# Patient Record
Sex: Female | Born: 1960 | Race: White | Hispanic: No | Smoking: Current every day smoker
Health system: Southern US, Community
[De-identification: ages and names within clinical notes are randomized; demographics above are authoritative.]

## PROBLEM LIST (undated history)

## (undated) DIAGNOSIS — F419 Anxiety disorder, unspecified: Secondary | ICD-10-CM

## (undated) DIAGNOSIS — E1101 Type 2 diabetes mellitus with hyperosmolarity with coma: Secondary | ICD-10-CM

## (undated) DIAGNOSIS — I10 Essential (primary) hypertension: Secondary | ICD-10-CM

## (undated) DIAGNOSIS — J449 Chronic obstructive pulmonary disease, unspecified: Secondary | ICD-10-CM

## (undated) DIAGNOSIS — Z923 Personal history of irradiation: Secondary | ICD-10-CM

## (undated) DIAGNOSIS — T7840XA Allergy, unspecified, initial encounter: Secondary | ICD-10-CM

## (undated) HISTORY — DX: Chronic obstructive pulmonary disease, unspecified: J44.9

## (undated) HISTORY — PX: ABDOMINAL HYSTERECTOMY: SHX81

## (undated) HISTORY — DX: Allergy, unspecified, initial encounter: T78.40XA

## (undated) HISTORY — DX: Type 2 diabetes mellitus with hyperosmolarity with coma: E11.01

---

## 2001-12-15 ENCOUNTER — Emergency Department (HOSPITAL_COMMUNITY): Admission: EM | Admit: 2001-12-15 | Discharge: 2001-12-15 | Payer: Self-pay | Admitting: Emergency Medicine

## 2005-04-25 ENCOUNTER — Emergency Department (HOSPITAL_COMMUNITY): Admission: EM | Admit: 2005-04-25 | Discharge: 2005-04-25 | Payer: Self-pay | Admitting: Family Medicine

## 2005-05-09 ENCOUNTER — Emergency Department (HOSPITAL_COMMUNITY): Admission: EM | Admit: 2005-05-09 | Discharge: 2005-05-09 | Payer: Self-pay | Admitting: Family Medicine

## 2005-05-29 ENCOUNTER — Emergency Department (HOSPITAL_COMMUNITY): Admission: EM | Admit: 2005-05-29 | Discharge: 2005-05-29 | Payer: Self-pay | Admitting: Emergency Medicine

## 2006-06-04 ENCOUNTER — Ambulatory Visit (HOSPITAL_COMMUNITY): Admission: RE | Admit: 2006-06-04 | Discharge: 2006-06-04 | Payer: Self-pay | Admitting: Family Medicine

## 2006-06-26 ENCOUNTER — Ambulatory Visit: Payer: Self-pay | Admitting: Internal Medicine

## 2006-06-29 ENCOUNTER — Encounter (INDEPENDENT_AMBULATORY_CARE_PROVIDER_SITE_OTHER): Payer: Self-pay | Admitting: Specialist

## 2006-06-29 ENCOUNTER — Ambulatory Visit (HOSPITAL_COMMUNITY): Admission: RE | Admit: 2006-06-29 | Discharge: 2006-06-29 | Payer: Self-pay | Admitting: Internal Medicine

## 2006-06-29 ENCOUNTER — Ambulatory Visit: Payer: Self-pay | Admitting: Internal Medicine

## 2006-07-04 ENCOUNTER — Ambulatory Visit (HOSPITAL_COMMUNITY): Admission: RE | Admit: 2006-07-04 | Discharge: 2006-07-04 | Payer: Self-pay | Admitting: Family Medicine

## 2006-07-11 ENCOUNTER — Ambulatory Visit: Payer: Self-pay | Admitting: Internal Medicine

## 2006-07-30 ENCOUNTER — Ambulatory Visit (HOSPITAL_COMMUNITY): Admission: RE | Admit: 2006-07-30 | Discharge: 2006-07-30 | Payer: Self-pay | Admitting: General Surgery

## 2006-12-21 ENCOUNTER — Emergency Department (HOSPITAL_COMMUNITY): Admission: EM | Admit: 2006-12-21 | Discharge: 2006-12-21 | Payer: Self-pay | Admitting: Emergency Medicine

## 2007-07-09 ENCOUNTER — Ambulatory Visit (HOSPITAL_COMMUNITY): Admission: RE | Admit: 2007-07-09 | Discharge: 2007-07-09 | Payer: Self-pay | Admitting: Family Medicine

## 2008-07-20 ENCOUNTER — Ambulatory Visit (HOSPITAL_COMMUNITY): Admission: RE | Admit: 2008-07-20 | Discharge: 2008-07-20 | Payer: Self-pay | Admitting: Family Medicine

## 2008-12-13 ENCOUNTER — Emergency Department (HOSPITAL_COMMUNITY): Admission: EM | Admit: 2008-12-13 | Discharge: 2008-12-13 | Payer: Self-pay | Admitting: Emergency Medicine

## 2009-06-17 ENCOUNTER — Emergency Department (HOSPITAL_COMMUNITY): Admission: EM | Admit: 2009-06-17 | Discharge: 2009-06-17 | Payer: Self-pay | Admitting: Emergency Medicine

## 2009-08-13 ENCOUNTER — Ambulatory Visit (HOSPITAL_COMMUNITY): Admission: RE | Admit: 2009-08-13 | Discharge: 2009-08-13 | Payer: Self-pay | Admitting: Family Medicine

## 2009-11-27 ENCOUNTER — Emergency Department (HOSPITAL_COMMUNITY): Admission: EM | Admit: 2009-11-27 | Discharge: 2009-11-27 | Payer: Self-pay | Admitting: Family Medicine

## 2009-12-20 ENCOUNTER — Ambulatory Visit: Payer: Self-pay | Admitting: Internal Medicine

## 2009-12-20 DIAGNOSIS — R5381 Other malaise: Secondary | ICD-10-CM

## 2009-12-20 DIAGNOSIS — R5383 Other fatigue: Secondary | ICD-10-CM

## 2009-12-20 DIAGNOSIS — K625 Hemorrhage of anus and rectum: Secondary | ICD-10-CM | POA: Insufficient documentation

## 2009-12-20 DIAGNOSIS — R109 Unspecified abdominal pain: Secondary | ICD-10-CM

## 2009-12-20 DIAGNOSIS — Z860100 Personal history of colon polyps, unspecified: Secondary | ICD-10-CM | POA: Insufficient documentation

## 2009-12-31 ENCOUNTER — Encounter: Payer: Self-pay | Admitting: Internal Medicine

## 2010-01-13 ENCOUNTER — Ambulatory Visit (HOSPITAL_COMMUNITY): Admission: RE | Admit: 2010-01-13 | Discharge: 2010-01-13 | Payer: Self-pay | Admitting: Internal Medicine

## 2010-01-13 ENCOUNTER — Ambulatory Visit: Payer: Self-pay | Admitting: Internal Medicine

## 2010-01-17 ENCOUNTER — Encounter: Payer: Self-pay | Admitting: Internal Medicine

## 2010-02-11 ENCOUNTER — Emergency Department (HOSPITAL_COMMUNITY): Admission: EM | Admit: 2010-02-11 | Discharge: 2010-02-11 | Payer: Self-pay | Admitting: Emergency Medicine

## 2010-02-17 ENCOUNTER — Emergency Department (HOSPITAL_COMMUNITY): Admission: EM | Admit: 2010-02-17 | Discharge: 2010-02-17 | Payer: Self-pay | Admitting: Emergency Medicine

## 2010-03-03 ENCOUNTER — Emergency Department (HOSPITAL_COMMUNITY): Admission: EM | Admit: 2010-03-03 | Discharge: 2010-03-03 | Payer: Self-pay | Admitting: Emergency Medicine

## 2010-03-07 ENCOUNTER — Emergency Department (HOSPITAL_COMMUNITY): Admission: EM | Admit: 2010-03-07 | Discharge: 2010-03-07 | Payer: Self-pay | Admitting: Emergency Medicine

## 2010-03-07 ENCOUNTER — Telehealth (INDEPENDENT_AMBULATORY_CARE_PROVIDER_SITE_OTHER): Payer: Self-pay

## 2010-03-08 ENCOUNTER — Ambulatory Visit: Payer: Self-pay | Admitting: Internal Medicine

## 2010-03-08 DIAGNOSIS — G44309 Post-traumatic headache, unspecified, not intractable: Secondary | ICD-10-CM | POA: Insufficient documentation

## 2010-03-08 DIAGNOSIS — K920 Hematemesis: Secondary | ICD-10-CM | POA: Insufficient documentation

## 2010-03-08 DIAGNOSIS — M545 Low back pain, unspecified: Secondary | ICD-10-CM | POA: Insufficient documentation

## 2010-03-08 DIAGNOSIS — S139XXA Sprain of joints and ligaments of unspecified parts of neck, initial encounter: Secondary | ICD-10-CM

## 2010-03-08 DIAGNOSIS — R1013 Epigastric pain: Secondary | ICD-10-CM | POA: Insufficient documentation

## 2010-03-09 ENCOUNTER — Ambulatory Visit: Payer: Self-pay | Admitting: Internal Medicine

## 2010-03-09 ENCOUNTER — Ambulatory Visit (HOSPITAL_COMMUNITY): Admission: RE | Admit: 2010-03-09 | Discharge: 2010-03-09 | Payer: Self-pay | Admitting: Internal Medicine

## 2010-04-20 ENCOUNTER — Ambulatory Visit: Payer: Self-pay | Admitting: Gastroenterology

## 2010-05-29 ENCOUNTER — Encounter: Payer: Self-pay | Admitting: Family Medicine

## 2010-06-09 NOTE — Letter (Signed)
Summary: ED REFERRAL  ED REFERRAL   Imported By: Rexene Alberts 03/09/2010 10:28:46  _____________________________________________________________________  External Attachment:    Type:   Image     Comment:   External Document

## 2010-06-09 NOTE — Assessment & Plan Note (Signed)
Summary: some rectal bleeding (not alot),consult for tcs/ss   Visit Type:  f/u Primary Care Provider:  Health Dept/does not go anymore  Chief Complaint:  Time for TCS/rectal bleeding.  History of Present Illness: Bethany Mills is here for recent rectal bleeding and to schedule surveillance TCS. She has FH of CRC in 53 y/o brother, personal h/o tubulovillous adenoma 3 yrs ago.  Recently small volume brbpr on toilet tissue after heavy lifting. No constipation, diarrhea, no rectal pain. Has some lower abd pain off/on in pelvis for couple of months. Has sharp pain, but rare. Has not seen gyn in over 10 yrs. Had hysterectomy for "tumor" but still has both ovaries. C/O fatigue. No heartburn, n/v. Weight up 30 pounds over 3 years. Takes ibuprofen as needed but not daily.   She c/o more "growths" around her anus. States she first had them prior to her last TCS. Treated with multiple creams that never really helped.  Current Medications (verified): 1)  Ibuprofen 200 Mg Tabs (Ibuprofen) .... Three Tablets As Needed 2)  Claritin 10 Mg Tabs (Loratadine) .... Take 1 Tablet By Mouth Once A Day 3)  Benadryl 25 Mg Tabs (Diphenhydramine Hcl) .... Two Tablets For Allergies Every 4-6 Hours  Allergies (verified): 1)  ! Codeine  Past History:  Past Medical History: Anxiety Disorder Anal Fissure TCS 2/08, Dr. Rourk-->?tiny thrombosed hemorrhoid vs anal fissure, large pedunculated rectal polyp, Tubulovillous Adenoma  Past Surgical History: Anal Fissurectomy w/sphincterotomy on 07/30/06 Right knee surgery Breast bx, benign Hysterectomy, partial, for tumor  Family History: Brother, colon cancer, age 27 Mother, breast cancer, age 22s  Social History: Single. Two sons.  Less than one ppd. Beer, 2-4 (12 ounce) twice per week. Used to drink more heavily.  Review of Systems General:  Complains of fatigue and weakness; denies fever, chills, sweats, anorexia, and weight loss. Eyes:  Denies vision loss. ENT:   Denies nasal congestion, sore throat, hoarseness, and difficulty swallowing. CV:  Denies chest pains, angina, palpitations, dyspnea on exertion, and peripheral edema. Resp:  Complains of dyspnea with exercise; denies dyspnea at rest, cough, sputum, and wheezing. GI:  See HPI. GU:  Denies urinary burning and blood in urine. MS:  Denies joint pain / LOM. Derm:  Denies rash and itching. Neuro:  Denies weakness, frequent headaches, memory loss, and confusion. Psych:  Denies depression and anxiety. Endo:  Complains of unusual weight change; 30 pound wt gain, three yrs. Heme:  Denies bruising and bleeding. Allergy:  Denies hives and rash.  Vital Signs:  Patient profile:   50 year old female Height:      64 inches Weight:      134 pounds BMI:     23.08 Temp:     98.1 degrees F oral Pulse rate:   80 / minute BP sitting:   120 / 90  (left arm) Cuff size:   regular  Vitals Entered By: Cloria Spring LPN (December 20, 2009 10:37 AM)  Physical Exam  General:  Well developed, well nourished, no acute distress. Head:  Normocephalic and atraumatic. Eyes:  sclera nonicteric Mouth:  Oropharyngeal mucosa moist, pink.  No lesions, erythema or exudate.    Neck:  Supple; no masses or thyromegaly. Lungs:  Clear throughout to auscultation. Heart:  Regular rate and rhythm; no murmurs, rubs,  or bruits. Abdomen:  Abd soft. Positive BS. ND. No HSM or masses. No abd bruit. Mild tenderness in pelvis bilaterally. No rebound or guarding. Rectal:  deferred until time of colonoscopy.   Extremities:  No clubbing, cyanosis, edema or deformities noted. Neurologic:  Alert and  oriented x4;  grossly normal neurologically. Skin:  Intact without significant lesions or rashes. Cervical Nodes:  No significant cervical adenopathy. Psych:  Alert and cooperative. Normal mood and affect.  Impression & Recommendations:  Problem # 1:  TUBULOVILLOUS ADENOMA, COLON, HX OF (ICD-V12.72)  H/O advance adenoma, 3 yrs ago, FH  of CRC in sibling. Due for high risk surveillance TCS. Recent rectal bleeding possible hemorrhoidal. Patient also c/o growths around her anus. Will evaluate at time of TCS. Colonoscopy to be performed in near future.  Risks, alternatives, and benefits including but not limited to the risk of reaction to medication, bleeding, infection, and perforation were addressed.  Patient voiced understanding and provided verbal consent.   Due to failed conscious sedation, h/o alcohol abuse in past, would recommend procedure in OR.   Orders: New Patient Level III (21308)  Problem # 2:  WEAKNESS (ICD-780.79)  Weakness and fatigue. No PCP, not seen in three years by MD. Routine labs at time of preop including CMET, TSH, CBC.   Orders: New Patient Level III (65784)  Problem # 3:  PELVIC  PAIN (ICD-789.09)  Referral to [facility] for pap, physical exam, chronic pelvic pain.  Orders: New Patient Level III (69629)  Appended Document: some rectal bleeding (not alot),consult for tcs/ss Pre-op labs: H/H normal. Met -7 unremarkable. Pt to f/u with PCP/GYN.

## 2010-06-09 NOTE — Letter (Signed)
Summary: Patient Notice, Colon Biopsy Results  [facility]  [address]   [place], [place] [address]   Phone: [phone]  Fax: [phone]       January 17, 2010   Bethany Mills [address] [place], [place]  [address] 05/23/1960    Dear [name],  I am pleased to inform you that the biopsies taken during your recent colonoscopy did not show any evidence of cancer upon pathologic examination.  Additional information/recommendations:  Continue with the treatment plan as outlined on the day of your exam.  You should have a repeat colonoscopy examination  in 5 years.  Please call us if you are having persistent problems or have questions about your condition that have not been fully answered at this time.  Sincerely,    R. Roetta Sessions MD, Caleen Essex  [facility] Ph: [phone]    Fax: [phone]   Appended Document: Patient Notice, Colon Biopsy Results letter mailed to pt  Appended Document: Patient Notice, Colon Biopsy Results REMINDER FOR REPEAT TCS IN 5 YRS IS IN THE COMPUTER

## 2010-06-09 NOTE — Progress Notes (Signed)
Summary: phone note/ pt having blood in stool/has appt tomorrow  Phone Note Call from Patient   Caller: Patient Summary of Call: Pt called and she has appt to see Tana Coast, PA tomorrow. She reports fever (although she doesn't have thermometer). She is having chills and had blood in stool x 2 this AM, and on paper also. She said it was not profuse, but she is just concerned and wanted advise.  Initial call taken by: Cloria Spring LPN,  March 07, 2010 10:52 AM     Appended Document: phone note/ pt having blood in stool/has appt tomorrow Unless she is having diarrhea, fever unlikely related to rectal bleeding. Advise is she truly has fever, address with PCP. Otherwise, see if Tobi Bastos can see her this afternoon.  Appended Document: phone note/ pt having blood in stool/has appt tomorrow Called pt, her Mom answered and said she is at the hospital now. She has appt in the AM and wanted to know if she shouuld keep that appt. I told her we would not cancel it, to let ER know  and let us know what they wanted.
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
## 2010-06-09 NOTE — Letter (Signed)
Summary: TCS ORDER  TCS ORDER   Imported By: Rosine Beat 12/31/2009 14:24:45  _____________________________________________________________________  External Attachment:    Type:   Image     Comment:   External Document

## 2010-06-09 NOTE — Letter (Signed)
Summary: EGD ORDER  EGD ORDER   Imported By: Rexene Alberts 03/08/2010 12:09:46  _____________________________________________________________________  External Attachment:    Type:   Image     Comment:   External Document

## 2010-07-20 LAB — URINALYSIS, ROUTINE W REFLEX MICROSCOPIC
Bilirubin Urine: NEGATIVE
Glucose, UA: NEGATIVE mg/dL
Ketones, ur: NEGATIVE mg/dL
Leukocytes, UA: NEGATIVE
Nitrite: NEGATIVE
Specific Gravity, Urine: 1.01 (ref 1.005–1.030)
Specific Gravity, Urine: <1.005 — ABNORMAL LOW (ref 1.005–1.030)
Urobilinogen, UA: 0.2 mg/dL (ref 0.0–1.0)
pH: 5.5 (ref 5.0–8.0)

## 2010-07-20 LAB — DIFFERENTIAL
Basophils Absolute: 0 10*3/uL (ref 0.0–0.1)
Basophils Relative: 0 % (ref 0–1)
Eosinophils Relative: 2 % (ref 0–5)
Lymphocytes Relative: 32 % (ref 12–46)
Lymphocytes Relative: 35 % (ref 12–46)
Monocytes Absolute: 0.9 10*3/uL (ref 0.1–1.0)
Monocytes Absolute: 0.9 10*3/uL (ref 0.1–1.0)
Monocytes Relative: 10 % (ref 3–12)
Monocytes Relative: 11 % (ref 3–12)
Neutro Abs: 4.9 10*3/uL (ref 1.7–7.7)

## 2010-07-20 LAB — CBC
HCT: 39.7 % (ref 36.0–46.0)
Hemoglobin: 13.5 g/dL (ref 12.0–15.0)
MCH: 31.6 pg (ref 26.0–34.0)
MCHC: 34.1 g/dL (ref 30.0–36.0)
Platelets: 377 10*3/uL (ref 150–400)
RBC: 3.99 MIL/uL (ref 3.87–5.11)
RDW: 14.1 % (ref 11.5–15.5)
WBC: 7.8 10*3/uL (ref 4.0–10.5)

## 2010-07-20 LAB — COMPREHENSIVE METABOLIC PANEL
AST: 22 U/L (ref 0–37)
Albumin: 3.7 g/dL (ref 3.5–5.2)
Albumin: 4.3 g/dL (ref 3.5–5.2)
Alkaline Phosphatase: 83 U/L (ref 39–117)
BUN: 3 mg/dL — ABNORMAL LOW (ref 6–23)
Chloride: 99 mEq/L (ref 96–112)
Creatinine, Ser: 0.49 mg/dL (ref 0.4–1.2)
GFR calc Af Amer: >60 mL/min (ref >60)
Potassium: 3.4 mEq/L — ABNORMAL LOW (ref 3.5–5.1)
Potassium: 3.6 mEq/L (ref 3.5–5.1)
Total Bilirubin: 0.5 mg/dL (ref 0.3–1.2)
Total Protein: 6.5 g/dL (ref 6.0–8.3)
Total Protein: 7.5 g/dL (ref 6.0–8.3)

## 2010-07-20 LAB — SAMPLE TO BLOOD BANK

## 2010-07-20 LAB — URINE MICROSCOPIC-ADD ON

## 2010-07-21 LAB — BASIC METABOLIC PANEL
BUN: 7 mg/dL (ref 6–23)
Chloride: 102 mEq/L (ref 96–112)
Glucose, Bld: 101 mg/dL — ABNORMAL HIGH (ref 70–99)
Potassium: 4.1 mEq/L (ref 3.5–5.1)

## 2010-07-27 LAB — CBC
HCT: 42.8 % (ref 36.0–46.0)
Hemoglobin: 14.5 g/dL (ref 12.0–15.0)
MCHC: 33.9 g/dL (ref 30.0–36.0)
RDW: 13 % (ref 11.5–15.5)

## 2010-07-27 LAB — DIFFERENTIAL
Basophils Absolute: 0 10*3/uL (ref 0.0–0.1)
Eosinophils Relative: 2 % (ref 0–5)
Lymphocytes Relative: 21 % (ref 12–46)
Monocytes Absolute: 0.8 10*3/uL (ref 0.1–1.0)
Monocytes Relative: 8 % (ref 3–12)

## 2010-07-27 LAB — POCT CARDIAC MARKERS
CKMB, poc: <1 ng/mL — ABNORMAL LOW (ref 1.0–8.0)
Myoglobin, poc: 33.1 ng/mL (ref 12–200)
Troponin i, poc: <0.05 ng/mL (ref 0.00–0.09)
Troponin i, poc: <0.05 ng/mL (ref 0.00–0.09)

## 2010-07-27 LAB — BASIC METABOLIC PANEL
CO2: 26 mEq/L (ref 19–32)
GFR calc non Af Amer: >60 mL/min (ref >60)
Glucose, Bld: 109 mg/dL — ABNORMAL HIGH (ref 70–99)
Potassium: 3.6 mEq/L (ref 3.5–5.1)
Sodium: 139 mEq/L (ref 135–145)

## 2010-09-01 ENCOUNTER — Other Ambulatory Visit (HOSPITAL_COMMUNITY): Payer: Self-pay | Admitting: Family Medicine

## 2010-09-01 DIAGNOSIS — Z139 Encounter for screening, unspecified: Secondary | ICD-10-CM

## 2010-09-06 ENCOUNTER — Ambulatory Visit (HOSPITAL_COMMUNITY): Payer: Self-pay

## 2010-09-13 ENCOUNTER — Ambulatory Visit (HOSPITAL_COMMUNITY)
Admission: RE | Admit: 2010-09-13 | Discharge: 2010-09-13 | Disposition: A | Discharge status: Home / Self Care | Payer: Self-pay | Source: Ambulatory Visit | Attending: Family Medicine | Admitting: Family Medicine

## 2010-09-13 DIAGNOSIS — Z139 Encounter for screening, unspecified: Secondary | ICD-10-CM

## 2010-09-23 NOTE — H&P (Signed)
NAMEMARDI, CANNADY              ACCOUNT NO.:  [id number]   MEDICAL RECORD NO.:  [id number]          PATIENT TYPE:  AMB   LOCATION:  DAY                           FACILITY:  [facility]   PHYSICIAN:  Dalia Heading, M.D.  DATE OF BIRTH:  06-12-1960   DATE OF ADMISSION:  DATE OF DISCHARGE:  LH                              HISTORY & PHYSICAL   CHIEF COMPLAINT:  Anal fissure.   HISTORY OF PRESENT ILLNESS:  The patient is a 50 year old white female  who is referred for evaluation and treatment of rectal pain.  It has  been present for over 1 year.  She has been on suppositories which have  not helped.  She is not constipated, though she sometimes strains to  move her bowels.   PAST MEDICAL HISTORY:  Includes anxiety.   PAST SURGICAL HISTORY:  Colonoscopy.   CURRENT MEDICATIONS:  Nitroglycerin ointment to the rectum.   ALLERGIES:  Codeine.   REVIEW OF SYSTEMS:  The patient smokes a pack cigarettes a day.  She  drinks alcohol socially.  She denies any other cardiopulmonary  difficulties or bleeding disorders.   PHYSICAL EXAMINATION:  GENERAL:  The patient is a well-developed, well-  nourished white female in no acute distress.  Mild anxiety is noted.  LUNGS:  Clear to auscultation with equal breath sounds bilaterally.  HEART:  Examination reveals regular rate and rhythm without S3, S4 or  murmurs.  RECTAL:  Examination reveals a posterior anal fissure with a sentinel  tag present.  Exam is limited secondary to pain.   IMPRESSION:  Anal fissure.   PLAN:  The patient is scheduled for an anal fissurectomy with  sphincterotomy on 07/30/2006.  The risks and benefits of the procedure  including bleeding, infection, incontinence, and recurrence of the  fissure were fully explained to the patient, gave informed consent.      Dalia Heading, M.D.  Electronically Signed     MAJ/MEDQ  D:  07/26/2006  T:  07/26/2006  Job:  [id number]   cc:   Dalia Heading, M.D.  Fax: [phone]   R.  Roetta Sessions, M.D.  [address]  [place]  [place] [address]   Selinda Flavin  Fax: [phone]

## 2010-09-23 NOTE — Consult Note (Signed)
NAME:  Bethany Mills, Bethany Mills              ACCOUNT NO.:  [id number]   MEDICAL RECORD NO.:  [id number]          PATIENT TYPE:  AMB   LOCATION:  DAY                           FACILITY:  [facility]   PHYSICIAN:  R. Roetta Sessions, M.D. DATE OF BIRTH:  04-10-1961   DATE OF CONSULTATION:  06/26/2006  DATE OF DISCHARGE:                                 CONSULTATION   REASON FOR CONSULTATION:  Rectal pain, discharge, rectal bleeding.   REFERRING PHYSICIAN:  Dr. Selinda Flavin and Dierdre Forth at [facility].   HISTORY OF PRESENT ILLNESS:  Bethany Mills is a pleasant 50 year old  Caucasian female sent at the courtesy of Dr. Selinda Flavin and Dierdre Forth to further evaluate a 1-year history of intermittent rectal  bleeding, rectal discharge and rectal pain. She describes severe burning  pain after she has a bowel movement. She denies having pain exactly when  she has a bowel movement, then has some discharge which burns her  perianal area. She has quite a bit of irritation even when she urinates,  when urine comes down between the vagina and the rectum, she has severe  pain to the point where she is now afraid to eat and has lost 10 pounds  in the past 1 year insidiously because of this problem that she reports.  She really has not had any abdominal pain; had some rectal bleeding  previously but none recently.   FAMILY HISTORY:  Significant in a brother diagnosed with colon cancer  age 67.   She has never had her lower GI tract imaged. She has been on various  creams without much improvement. When she has to pick something up as  light as 10 pounds or so, this tends to exacerbate the irritating  sensation she gets around the rectum.   PAST MEDICAL HISTORY:  1. Significant for anxiety neurosis.  2. A benign lump of the breast.   PAST SURGICAL HISTORY:  Hysterectomy; right knee surgery; breast cyst  biopsy of right breast.   CURRENT MEDICATIONS:  Aleve p.r.n., Proctocort HC  p.r.n.   ALLERGIES:  CODEINE.   FAMILY HISTORY:  As outlined above.   SOCIAL HISTORY:  The patient is single. She has one son. She works part  time at [employer]. She smokes no more than a pack of  cigarettes per day.  She does drink alcohol on a regular basis, a couple of beers at least  twice weekly. She has had a 6-pack nightly for the past 3 nights because  of the pain that she describes.   REVIEW OF SYSTEMS:  Weight loss as outlined above. No fever, chills. No  chest pain, dyspnea on exertion.   PHYSICAL EXAMINATION:  GENERAL APPEARANCE: Examination reveals a  pleasant 50 year old lady resting comfortably.  VITAL SIGNS: Weight 113.5 pounds. Height 5 feet, 3 inches. Temperature  97.5, blood pressure 100/62, pulse 80.  SKIN: Warm and dry. There is no jaundice or stigmata of chronic liver  disease.  HEENT EXAM: No scleral icterus. Conjunctivae are pink.  NECK: The JVD is not prominent.  CHEST: Lungs are  clear to auscultation.  CARDIAC EXAM: Regular rate and rhythm without murmurs, rubs, or gallops.  ABDOMEN: Flat, positive bowel sounds, soft, nontender without  appreciable mass or organomegaly.  EXTREMITIES: No edema.  RECTAL EXAM: Perianal exam reveals marked induration of the perianal  skin without ulceration or erosions. I do not see any external  hemorrhage. Digital exam is exquisitely tender for this lady. I do not  appreciate any rectal masses. Scant brown stool is Hemoccult negative.   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 She has  a history of rectal bleeding in the setting of a positive family history  of colon cancer in a first degree relative. She is exquisitely tender on  digital rectal examination. I suspect she has an occult fissure.  Her  regular alcohol intake may be exacerbating this condition, in part.   RECOMMENDATIONS:  Mycolog II Cream to the  perianal skin b.i.d.  Anamantle Forte applied to the rectum t.i.d.  Will move towards colonoscopy next week.   I discussed the risks, benefits and alternatives for this nice lady. If  she does have a fissure she may need eventual definitive surgical  intervention. Will make further recommendations in the very near future.   I would like to thank Dr. Selinda Flavin and Dierdre Forth for allowing to  see this nice lady today.      Jonathon Bellows, M.D.  Electronically Signed     RMR/MEDQ  D:  06/26/2006  T:  06/26/2006  Job:  [id number]

## 2010-09-23 NOTE — Op Note (Signed)
[name], [name]              ACCOUNT NO.:  [id number]   MEDICAL RECORD NO.:  [id number]          PATIENT TYPE:  AMB   LOCATION:                                FACILITY:  [facility]   PHYSICIAN:  R. Roetta Sessions, M.D. DATE OF BIRTH:  09/11/1960   DATE OF PROCEDURE:  06/29/2006  DATE OF DISCHARGE:                                PROCEDURE NOTE   COLONOSCOPY WITH POLYPECTOMY   INDICATIONS FOR PROCEDURE:  Patient is a 50 year old lady with a 1 year  history of intermittent rectal bleeding, rectal discharge and rectal  pain.  I saw her in the office June 26, 2006 and noticed that her  perianal skin was markedly inflamed and she had a very tender digital  exam but otherwise unremarkable.   I prescribed Mycolog-II cream to the perianal area and AnaMantle Forte  t.i.d. to the anorectum.  She did not use the Mycolog, she says she has  tried it before and it did not work.  She, however, did use the  AnaMantle with significant improvement in her anal/rectal pain.  Colonoscopy is now being done.  __________ was discussed with the  patient.  Potential risks, benefits and alternatives have been reviewed.  Questions answered.  She is agreeable.  Please see documentation for  medical record.   PROCEDURE NOTE:  __________ procedure conscious sedation Demerol 200 mg  IV and Versed 8 mg IV in divided doses.  Viscus Xylocaine used for  anorectal topical anesthesia with digital rectal exam.  She did have a  small, pea-sized lesion in the rectum that was very tender, which could  be a small thrombosed hemorrhoid suspicious of an occult fissure.  Endoscopic finding.  The prep was good.  Examination of the rectal  mucosa with retroflexion to the anal verge revealed a 1.5 cm angry-  appearing pedunculated polyp at 15 cm from the anal verge, please see  photographs.  Rectal mucosa otherwise appeared normal.   COLONOSCOPY:  The colonic mucosa was surveyed from the rectosigmoid junction through  the  left transverse, right colon, appendiceal orifice, ileocecal valve  and cecum.  These structures were well seen and photographed for the  record.  __________ cautiously withdrawn and all previous mucosal  surfaces were again seen.  The colonic mucosa appeared normal.  The  polyp in the rectum was resected with one pass of the snare cautery loop  and it was recovered with a __________ net.   The patient tolerated the procedure well and was __________.   IMPRESSION:  1. Tender anal canal, question a tiny thrombosed hemorrhoid versus      probable anal fissure.  Large pedunculated rectal polyp status post      snare removal as described above.  Otherwise normal rectum.  2. Normal colon.   RECOMMENDATIONS:  1. No aspirin or non-steroidal medications for 10 days.  2. __________.  3. Continue AnaMantle Forte to the anal canal t.i.d.  4. Add nitroglycerin ointment 0.2% to the anorectum t.i.d. following      the AnaMantle application.   It is notable the perianal skin irritation which was  noted earlier in  the week has completely resolved.  I will make further recommendation  when the path is available for review.  The patient is not to take any  aspirin or non-steroidal agents for the next 10 days.      Bethany Mills, M.D.  Electronically Signed     RMR/MEDQ  D:  06/29/2006  T:  06/29/2006  Job:  [id number]   cc:   Dr. Dierdre Forth  [facility] Dept.

## 2010-09-23 NOTE — Op Note (Signed)
Bethany Mills, Bethany Mills              ACCOUNT NO.:  [id number]   MEDICAL RECORD NO.:  [id number]          PATIENT TYPE:  AMB   LOCATION:  DAY                           FACILITY:  [facility]   PHYSICIAN:  Dalia Heading, M.D.  DATE OF BIRTH:  1961-01-26   DATE OF PROCEDURE:  07/30/2006  DATE OF DISCHARGE:                               OPERATIVE REPORT   PREOPERATIVE DIAGNOSIS:  Anal fissure.   POSTOPERATIVE DIAGNOSIS:  Anal fissure.   PROCEDURE:  Anal fissurotomy with internal lateral sphincterotomy.   SURGEON:  Dr. Franky Macho   ANESTHESIA:  General.   INDICATIONS:  The patient is a 50 year old white female presents with a  chronic anal fissure.  Despite medical therapy, she continues to have  rectal pain.  The risks and benefits of the procedure including  bleeding, infection, incontinence, and recurrence of the fissure were  fully explained to the patient, gave informed consent.   PROCEDURE NOTE:  The patient was placed in the lithotomy position after  general anesthesia was administered.  The perineum was prepped and  draped using the usual sterile technique with Betadine.  Surgical site  confirmation was performed.   The patient had chronic scarring along the posterior aspect of the anus.  No significant hemorrhoidal disease was noted.  The fissure was excised  without difficulty.  Care was taken to avoid the external sphincter.  The mucosal edges were reapproximated using a running 2-0 Vicryl suture.  An incision was then made outside the anal verge along the left side of  the anus.  The internal sphincter was found, and it was divided without  difficulty.  Again, care was taken to avoid the external sphincter  mechanism which was noted to be intact at the end of the procedure.  The  small incision was closed using 2-0 Vicryl interrupted sutures.  Sensorcaine 0.5% was instilled in the surrounding wound.  Surgicel and  viscous Xylocaine packing was then placed into the  rectum.   All tape and needle counts correct at the end of the procedure.  The  patient was awakened and transferred to PACU in stable condition.   COMPLICATIONS:  None.   SPECIMEN:  None.   BLOOD LOSS:  Minimal.      Dalia Heading, M.D.  Electronically Signed     MAJ/MEDQ  D:  07/30/2006  T:  07/30/2006  Job:  [id number]   cc:   R. Roetta Sessions, M.D.  [id number]  [place]  [place] [address]   Selinda Flavin  Fax: [phone]

## 2011-01-14 ENCOUNTER — Inpatient Hospital Stay (INDEPENDENT_AMBULATORY_CARE_PROVIDER_SITE_OTHER)
Admission: RE | Admit: 2011-01-14 | Discharge: 2011-01-14 | Disposition: A | Discharge status: Home / Self Care | Payer: Self-pay | Source: Ambulatory Visit | Attending: Emergency Medicine | Admitting: Emergency Medicine

## 2011-01-14 DIAGNOSIS — J309 Allergic rhinitis, unspecified: Secondary | ICD-10-CM

## 2011-01-14 DIAGNOSIS — H109 Unspecified conjunctivitis: Secondary | ICD-10-CM

## 2011-01-30 ENCOUNTER — Encounter: Payer: Self-pay | Admitting: *Deleted

## 2011-01-30 ENCOUNTER — Emergency Department (HOSPITAL_COMMUNITY): Payer: Self-pay

## 2011-01-30 ENCOUNTER — Emergency Department (HOSPITAL_COMMUNITY)
Admission: EM | Admit: 2011-01-30 | Discharge: 2011-01-30 | Disposition: A | Discharge status: Home / Self Care | Payer: Self-pay | Attending: Emergency Medicine | Admitting: Emergency Medicine

## 2011-01-30 DIAGNOSIS — S63502A Unspecified sprain of left wrist, initial encounter: Secondary | ICD-10-CM

## 2011-01-30 DIAGNOSIS — S93401A Sprain of unspecified ligament of right ankle, initial encounter: Secondary | ICD-10-CM

## 2011-01-30 DIAGNOSIS — M25579 Pain in unspecified ankle and joints of unspecified foot: Secondary | ICD-10-CM | POA: Insufficient documentation

## 2011-01-30 DIAGNOSIS — S63509A Unspecified sprain of unspecified wrist, initial encounter: Secondary | ICD-10-CM | POA: Insufficient documentation

## 2011-01-30 DIAGNOSIS — M25539 Pain in unspecified wrist: Secondary | ICD-10-CM | POA: Insufficient documentation

## 2011-01-30 DIAGNOSIS — S93409A Sprain of unspecified ligament of unspecified ankle, initial encounter: Secondary | ICD-10-CM | POA: Insufficient documentation

## 2011-01-30 DIAGNOSIS — F17200 Nicotine dependence, unspecified, uncomplicated: Secondary | ICD-10-CM | POA: Insufficient documentation

## 2011-01-30 DIAGNOSIS — R296 Repeated falls: Secondary | ICD-10-CM | POA: Insufficient documentation

## 2011-01-30 DIAGNOSIS — Y92009 Unspecified place in unspecified non-institutional (private) residence as the place of occurrence of the external cause: Secondary | ICD-10-CM | POA: Insufficient documentation

## 2011-01-30 MED ORDER — TRAMADOL HCL 50 MG PO TABS: 50.0000 mg | ORAL_TABLET | Freq: Four times a day (QID) | ORAL | Status: AC | PRN

## 2011-01-30 NOTE — ED Provider Notes (Signed)
Medical screening examination/treatment/procedure(s) were performed by non-physician practitioner and as supervising physician I was immediately available for consultation/collaboration.  Jazzlene Huot B. Bernette Mayers, MD 01/30/11 1241

## 2011-01-30 NOTE — ED Notes (Signed)
Pt states that she fell in a hole on Saturday. Pt c/o pain and swelling in her right foot, right ankle and left wrist. Bruising noted to right foot.

## 2011-01-30 NOTE — ED Notes (Signed)
L wrist splint and R ASO applied. Pt given crutches and adjusted for pt height and arm length. Pt reports she has used crutches before.

## 2011-01-30 NOTE — ED Provider Notes (Signed)
History     CSN: [id number] Arrival date & time: 01/30/2011 10:58 AM  Chief Complaint  Patient presents with  . Fall    HPI  (Consider location/radiation/quality/duration/timing/severity/associated sxs/prior treatment)  Patient is a 50 y.o. female presenting with fall. The history is provided by the patient. No language interpreter was used.  Fall The accident occurred 2 days ago. Incident: pt was walking down stairs.  when she stepped off the last one onto a mat, it was covering a hole.  she inverted her R ankle and fell on extended L hand. She landed on dirt. The point of impact was the left wrist. The pain is present in the left wrist (R ankle). The pain is at a severity of 7/10. She was ambulatory at the scene. There was no entrapment after the fall. There was no drug use involved in the accident. There was no alcohol use involved in the accident. The symptoms are aggravated by activity and ambulation. She has tried nothing for the symptoms.    History reviewed. No pertinent past medical history.  Past Surgical History  Procedure Date  . Abdominal hysterectomy     History reviewed. No pertinent family history.  History  Substance Use Topics  . Smoking status: Current Everyday Smoker    Types: Cigarettes  . Smokeless tobacco: Not on file  . Alcohol Use: No    OB History    Grav Para Term Preterm Abortions TAB SAB Ect Mult Living                  Review of Systems  Review of Systems  Musculoskeletal: Positive for joint swelling and arthralgias.    Allergies  Codeine; Neomycin-polymyxin b; and Triple antibiotic  Home Medications   Current Outpatient Rx  Name Route Sig Dispense Refill  . ACETAMINOPHEN 500 MG PO TABS Oral Take 2,000 mg by mouth every 6 (six) hours as needed. Pain       Physical Exam    BP 150/81  Pulse 90  Temp(Src) 97.9 F (36.6 C) (Oral)  Resp 18  Ht 5\' 4"  (1.626 m)  Wt 127 lb (57.607 kg)  BMI 21.80 kg/m2  SpO2 98%  Physical Exam    Nursing note and vitals reviewed. Constitutional: She is oriented to person, place, and time. She appears well-developed and well-nourished. No distress.  HENT:  Head: Normocephalic and atraumatic.  Eyes: EOM are normal.  Neck: Normal range of motion.  Cardiovascular: Normal rate, regular rhythm and normal heart sounds.   Pulmonary/Chest: Effort normal and breath sounds normal.  Abdominal: Soft. She exhibits no distension. There is no tenderness.  Musculoskeletal: She exhibits tenderness.       Left wrist: She exhibits decreased range of motion, tenderness and bony tenderness. She exhibits no swelling, no effusion, no crepitus, no deformity and no laceration.       Arms:      Feet:  Neurological: She is alert and oriented to person, place, and time.  Skin: Skin is warm and dry. She is not diaphoretic.  Psychiatric: She has a normal mood and affect. Judgment normal.    ED Course  Procedures (including critical care time)  Labs Reviewed - No data to display Dg Wrist Complete Left  01/30/2011  *RADIOLOGY REPORT*  Clinical Data: Fall.  Left wrist swelling.  LEFT WRIST - COMPLETE 3+ VIEW  Comparison: None.  Findings: Degenerative spurring at the first carpometacarpal articulation noted.  There is mild spurring of the mid scaphoid, but without a  discrete scaphoid fracture observed.  No other fracture is noted.  IMPRESSION:  1.  No acute bony findings are identified. 2.  Degenerative spurring at the first carpometacarpal articulation.  Original Report Authenticated By: Dellia Cloud, M.D.   Dg Ankle Complete Right  01/30/2011  *RADIOLOGY REPORT*  Clinical Data: Fall.  Right ankle and left wrist swelling.  RIGHT ANKLE - COMPLETE 3+ VIEW  Comparison: None.  Findings: Mild soft tissue swelling over the lateral malleolus noted without underlying fracture observed.  The plafond and talar dome appear intact.  No cortical discontinuity in the base of the fifth metatarsal was observed.   IMPRESSION:  1.  Lateral soft tissue swelling, without observed fracture.  Original Report Authenticated By: Dellia Cloud, M.D.     No diagnosis found.   MDM         Worthy Rancher, PA 01/30/11 1240

## 2012-05-23 ENCOUNTER — Other Ambulatory Visit (HOSPITAL_COMMUNITY): Payer: Self-pay | Admitting: Nurse Practitioner

## 2012-05-23 DIAGNOSIS — Z1231 Encounter for screening mammogram for malignant neoplasm of breast: Secondary | ICD-10-CM

## 2012-06-03 ENCOUNTER — Ambulatory Visit (HOSPITAL_COMMUNITY)
Admission: RE | Admit: 2012-06-03 | Discharge: 2012-06-03 | Disposition: A | Discharge status: Home / Self Care | Payer: Self-pay | Source: Ambulatory Visit | Attending: Family Medicine | Admitting: Family Medicine

## 2012-06-03 DIAGNOSIS — Z1231 Encounter for screening mammogram for malignant neoplasm of breast: Secondary | ICD-10-CM

## 2012-08-22 ENCOUNTER — Ambulatory Visit (HOSPITAL_COMMUNITY)
Admission: RE | Admit: 2012-08-22 | Discharge: 2012-08-22 | Disposition: A | Discharge status: Home / Self Care | Payer: Disability Insurance | Source: Ambulatory Visit | Attending: Family Medicine | Admitting: Family Medicine

## 2012-08-22 ENCOUNTER — Other Ambulatory Visit (HOSPITAL_COMMUNITY): Payer: Self-pay | Admitting: *Deleted

## 2012-08-22 DIAGNOSIS — M545 Low back pain, unspecified: Secondary | ICD-10-CM

## 2012-10-02 ENCOUNTER — Ambulatory Visit (HOSPITAL_COMMUNITY)
Admission: RE | Admit: 2012-10-02 | Discharge: 2012-10-02 | Disposition: A | Discharge status: Home / Self Care | Payer: Disability Insurance | Source: Ambulatory Visit | Attending: Pediatrics | Admitting: Pediatrics

## 2012-10-02 ENCOUNTER — Other Ambulatory Visit (HOSPITAL_COMMUNITY): Payer: Self-pay | Admitting: Pediatrics

## 2012-10-02 DIAGNOSIS — M25569 Pain in unspecified knee: Secondary | ICD-10-CM | POA: Insufficient documentation

## 2012-10-02 DIAGNOSIS — R52 Pain, unspecified: Secondary | ICD-10-CM

## 2012-10-25 ENCOUNTER — Encounter (HOSPITAL_COMMUNITY): Payer: Self-pay | Admitting: *Deleted

## 2012-10-25 ENCOUNTER — Emergency Department (HOSPITAL_COMMUNITY): Payer: Self-pay

## 2012-10-25 ENCOUNTER — Telehealth: Payer: Self-pay | Admitting: Internal Medicine

## 2012-10-25 ENCOUNTER — Emergency Department (HOSPITAL_COMMUNITY)
Admission: EM | Admit: 2012-10-25 | Discharge: 2012-10-25 | Disposition: A | Discharge status: Home / Self Care | Payer: Self-pay | Attending: Emergency Medicine | Admitting: Emergency Medicine

## 2012-10-25 DIAGNOSIS — F430 Acute stress reaction: Secondary | ICD-10-CM | POA: Insufficient documentation

## 2012-10-25 DIAGNOSIS — R109 Unspecified abdominal pain: Secondary | ICD-10-CM | POA: Insufficient documentation

## 2012-10-25 DIAGNOSIS — F17210 Nicotine dependence, cigarettes, uncomplicated: Secondary | ICD-10-CM

## 2012-10-25 DIAGNOSIS — E876 Hypokalemia: Secondary | ICD-10-CM | POA: Insufficient documentation

## 2012-10-25 DIAGNOSIS — F419 Anxiety disorder, unspecified: Secondary | ICD-10-CM

## 2012-10-25 DIAGNOSIS — I10 Essential (primary) hypertension: Secondary | ICD-10-CM | POA: Insufficient documentation

## 2012-10-25 DIAGNOSIS — R042 Hemoptysis: Secondary | ICD-10-CM | POA: Insufficient documentation

## 2012-10-25 DIAGNOSIS — F411 Generalized anxiety disorder: Secondary | ICD-10-CM | POA: Insufficient documentation

## 2012-10-25 DIAGNOSIS — F17200 Nicotine dependence, unspecified, uncomplicated: Secondary | ICD-10-CM | POA: Insufficient documentation

## 2012-10-25 DIAGNOSIS — Z79899 Other long term (current) drug therapy: Secondary | ICD-10-CM | POA: Insufficient documentation

## 2012-10-25 HISTORY — DX: Anxiety disorder, unspecified: F41.9

## 2012-10-25 HISTORY — DX: Essential (primary) hypertension: I10

## 2012-10-25 LAB — CBC WITH DIFFERENTIAL/PLATELET
Basophils Relative: 0 % (ref 0–1)
Eosinophils Absolute: 0.1 10*3/uL (ref 0.0–0.7)
Hemoglobin: 14.2 g/dL (ref 12.0–15.0)
MCH: 32.3 pg (ref 26.0–34.0)
MCHC: 34.1 g/dL (ref 30.0–36.0)
Monocytes Absolute: 0.6 10*3/uL (ref 0.1–1.0)
Monocytes Relative: 9 % (ref 3–12)
Neutro Abs: 3.8 10*3/uL (ref 1.7–7.7)
Neutrophils Relative %: 54 % (ref 43–77)
RDW: 13.8 % (ref 11.5–15.5)

## 2012-10-25 LAB — BASIC METABOLIC PANEL
CO2: 27 mEq/L (ref 19–32)
Calcium: 9.2 mg/dL (ref 8.4–10.5)
Glucose, Bld: 110 mg/dL — ABNORMAL HIGH (ref 70–99)
Sodium: 141 mEq/L (ref 135–145)

## 2012-10-25 MED ORDER — POTASSIUM CHLORIDE CRYS ER 20 MEQ PO TBCR: 20.0000 meq | EXTENDED_RELEASE_TABLET | Freq: Two times a day (BID) | ORAL | Status: DC

## 2012-10-25 MED ORDER — AMOXICILLIN 500 MG PO CAPS: 500.0000 mg | ORAL_CAPSULE | Freq: Three times a day (TID) | ORAL | Status: DC

## 2012-10-25 NOTE — Telephone Encounter (Signed)
I talked with Pt and I advise her to go to the ER to get checked out. She has coughed up the blood twice today. No fever.

## 2012-10-25 NOTE — ED Provider Notes (Signed)
History     CSN: [id number]  Arrival date & time 10/25/12  1240   First MD Initiated Contact with Patient 10/25/12 1258      Chief Complaint  Patient presents with  . Anxiety  . Abdominal Pain  . Hemoptysis    (Consider location/radiation/quality/duration/timing/severity/associated sxs/prior treatment) HPI  Patient states she was brushing her teeth this morning and she coughed andshe coughed up some blood. She states this happened about 3 times for a total of about 2 teaspoons of blood. She states it was not mixed in mucus. She states it was bright red in color.  She denies that she is vomiting blood or having nausea or abdominal pain. She denies cough, fever, shortness of breath or chest pain. She states she rinsed her mouth out several times and did not feel like the blood was coming from her mouth. She denies nosebleed. She denies any change in her coughing pattern. She denies any prior history of doing this before.   Patient also relates she has a lot of anxiety. She states she is extremely stressed and appealed her denial of disability at the social security office yesterday. She states the last time she was in psychiatric care was over 10 years ago. She states antidepressants do not help with her anxiety. She has been buying valium off the street for the last 10 years. She states the 10 mg tablets are $2 apiece. She relates her last dose of valium was a week and a half ago. She denies SI or HI. 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   PCP none  Past Medical History  Diagnosis Date  . Hypertension   . Anxiety     Past Surgical History  Procedure Laterality Date  . Abdominal hysterectomy      No family history on file.  History  Substance Use Topics  . Smoking status: Current Every Day Smoker    Types: Cigarettes  . Smokeless tobacco: Not on file  . Alcohol Use: No  unemployed occassional wine with diner no  permanent home, lives between friends  OB History   Grav Para Term Preterm Abortions TAB SAB Ect Mult Living                  Review of Systems  All other systems reviewed and are negative.    Allergies  Codeine; Neomycin-polymyxin b gu; and Neomycin-bacitracin zn-polymyx  Home Medications   Current Outpatient Rx  Name  Route  Sig  Dispense  Refill  . acetaminophen (TYLENOL) 500 MG tablet   Oral   Take 2,000 mg by mouth every 6 (six) hours as needed. Pain          . diazepam (VALIUM) 10 MG tablet   Oral   Take 10 mg by mouth every 6 (six) hours as needed for anxiety.         Marland Kitchen ibuprofen (ADVIL,MOTRIN) 200 MG tablet   Oral   Take 400 mg by mouth every 6 (six) hours as needed for pain.           BP 181/90  Pulse 86  Temp(Src) 98 F (36.7 C) (Oral)  Resp 16  Ht 5\' 3"  (1.6 m)  Wt 124 lb (56.246 kg)  BMI 21.97 kg/m2  SpO2 99%  Vital signs normal    Physical Exam  Nursing note and vitals reviewed. Constitutional: She is oriented to person, place, and time. She appears well-developed and  well-nourished.  Non-toxic appearance. She does not appear ill. No distress.  HENT:  Head: Normocephalic and atraumatic.  Right Ear: External ear normal.  Left Ear: External ear normal.  Nose: Nose normal. No mucosal edema or rhinorrhea.  Mouth/Throat: Oropharynx is clear and moist and mucous membranes are normal. No dental abscesses or edematous.  No source of bleeding seen in her oral pharynx  Eyes: Conjunctivae and EOM are normal. Pupils are equal, round, and reactive to light.  Neck: Normal range of motion and full passive range of motion without pain. Neck supple.  Cardiovascular: Normal rate, regular rhythm and normal heart sounds.  Exam reveals no gallop and no friction rub.   No murmur heard. Pulmonary/Chest: Effort normal and breath sounds normal. No respiratory distress. She has no wheezes. She has no rhonchi. She has no rales. She exhibits no tenderness and no  crepitus.  Abdominal: Soft. Normal appearance and bowel sounds are normal. She exhibits no distension. There is no tenderness. There is no rebound and no guarding.  Musculoskeletal: Normal range of motion. She exhibits no edema and no tenderness.  Moves all extremities well.   Neurological: She is alert and oriented to person, place, and time. She has normal strength. No cranial nerve deficit.  Skin: Skin is warm, dry and intact. No rash noted. No erythema. No pallor.  Psychiatric: She has a normal mood and affect. Her speech is normal and behavior is normal. Her mood appears not anxious.    ED Course  Procedures (including critical care time)  13:50 Diane, ACT will give patient mental health referrals.   Pt given results of her tests, advised to be rechecked if she continues coughing up blood. She has remained stable in the ED with no coughing up of blood.   Results for orders placed during the hospital encounter of 10/25/12  CBC WITH DIFFERENTIAL      Result Value Range   WBC 7.1  4.0 - 10.5 K/uL   RBC 4.39  3.87 - 5.11 MIL/uL   Hemoglobin 14.2  12.0 - 15.0 g/dL   HCT 30.8  65.7 - 84.6 %   MCV 94.8  78.0 - 100.0 fL   MCH 32.3  26.0 - 34.0 pg   MCHC 34.1  30.0 - 36.0 g/dL   RDW 96.2  95.2 - 84.1 %   Platelets 269  150 - 400 K/uL   Neutrophils Relative % 54  43 - 77 %   Neutro Abs 3.8  1.7 - 7.7 K/uL   Lymphocytes Relative 36  12 - 46 %   Lymphs Abs 2.6  0.7 - 4.0 K/uL   Monocytes Relative 9  3 - 12 %   Monocytes Absolute 0.6  0.1 - 1.0 K/uL   Eosinophils Relative 2  0 - 5 %   Eosinophils Absolute 0.1  0.0 - 0.7 K/uL   Basophils Relative 0  0 - 1 %   Basophils Absolute 0.0  0.0 - 0.1 K/uL  BASIC METABOLIC PANEL      Result Value Range   Sodium 141  135 - 145 mEq/L   Potassium 3.3 (*) 3.5 - 5.1 mEq/L   Chloride 102  96 - 112 mEq/L   CO2 27  19 - 32 mEq/L   Glucose, Bld 110 (*) 70 - 99 mg/dL   BUN 7  6 - 23 mg/dL   Creatinine, Ser 3.24  0.50 - 1.10 mg/dL   Calcium 9.2   8.4 - 40.1 mg/dL   GFR  calc non Af Amer >90  >90 mL/min   GFR calc Af Amer >90  >90 mL/min   Laboratory interpretation all normal except mild hypokalemia   Dg Chest 2 View  10/25/2012   *RADIOLOGY REPORT*  Clinical Data: Hemoptysis, smoker  CHEST - 2 VIEW  Comparison: Prior chest x-ray 02/17/2010  Findings: Mild pulmonary hyperexpansion and central bronchitic changes.  No focal airspace consolidation, edema or suspicious pulmonary nodule.  No pleural effusion or pneumothorax.  Cardiac and mediastinal contours are within normal limits.  No acute osseous abnormality.  Mild prominence of the interstitial markings.  IMPRESSION:  1.  No acute cardiopulmonary disease. 2.  No conventional radiographic findings to explain the patient's hemoptysis. 3.  Mild prominence of the interstitial markings and central bronchitic changes may reflect chronic smoking related changes and / or COPD.   Original Report Authenticated By: Malachy Moan, M.D.    1. Hemoptysis   2. Cigarette smoker   3. Hypokalemia with normal acid-base balance   4. Anxiety      Discharge Medication List as of 10/25/2012  3:03 PM    START taking these medications   Details  amoxicillin (AMOXIL) 500 MG capsule Take 1 capsule (500 mg total) by mouth 3 (three) times daily., Starting 10/25/2012, Until Discontinued, Print    potassium chloride SA (K-DUR,KLOR-CON) 20 MEQ tablet Take 1 tablet (20 mEq total) by mouth 2 (two) times daily., Starting 10/25/2012, Until Discontinued, Print        Plan discharge   Devoria Albe, MD, FACEP     MDM patient has history of smoking and now with hemoptysis. She is clear that it is not from her mouth or that she is vomiting blood. She does not have any other symptoms such as shortness of breath or chest pain or fever. She's been treated for bronchitis however she continues to have hemoptysis or if it gets worse she will need further studies such as CT of the chest and/or bronchoscopy. At this time she  has no other symptoms to suggest congestive heart failure, PE.           Ward Givens, MD 10/25/12 2209

## 2012-10-25 NOTE — ED Notes (Signed)
Pt states that she has been buying Valium off the street for over 1 year, states that about 6 days ago she had an episode of vomiting bright red blood.  States that she has had blood in her vomit previously and diagnosed with ulcers by Dr. Lovena Neighbours.  States that she coughed up some blood this morning.  States that she is having strong smelling urine in the mornings for approx 1 month, denies other urinary symptoms.

## 2012-10-25 NOTE — ED Notes (Signed)
Pt states she has been taking valium off the street because she does not have a doctor. Pt states she has not had any in over  A week. Pt states abdomen has been hurting x 2 weeks. States she coughed up bright red blood this morning.~ 1 tsp full on 3 different occassions today.

## 2012-10-25 NOTE — Telephone Encounter (Signed)
Pt called and asked what does it mean when you are coughing up bright red blood. I told her that nurse said that if she was doing that that she needed to go to the emergency room. Pt again wants to know what is causing this and I told her that it could be a number of things, but she needs to go to ER. She still wants to speak with nurse. JL is aware

## 2013-02-21 DIAGNOSIS — M5030 Other cervical disc degeneration, unspecified cervical region: Secondary | ICD-10-CM | POA: Insufficient documentation

## 2013-02-21 DIAGNOSIS — F17200 Nicotine dependence, unspecified, uncomplicated: Secondary | ICD-10-CM | POA: Insufficient documentation

## 2013-03-21 ENCOUNTER — Emergency Department (INDEPENDENT_AMBULATORY_CARE_PROVIDER_SITE_OTHER)
Admission: EM | Admit: 2013-03-21 | Discharge: 2013-03-21 | Disposition: A | Discharge status: Home / Self Care | Payer: Self-pay | Source: Home / Self Care | Attending: Emergency Medicine | Admitting: Emergency Medicine

## 2013-03-21 ENCOUNTER — Encounter (HOSPITAL_COMMUNITY): Payer: Self-pay | Admitting: Emergency Medicine

## 2013-03-21 ENCOUNTER — Other Ambulatory Visit (HOSPITAL_COMMUNITY)
Admission: RE | Admit: 2013-03-21 | Discharge: 2013-03-21 | Disposition: A | Discharge status: Home / Self Care | Payer: Disability Insurance | Source: Ambulatory Visit | Attending: Emergency Medicine | Admitting: Emergency Medicine

## 2013-03-21 DIAGNOSIS — R300 Dysuria: Secondary | ICD-10-CM

## 2013-03-21 DIAGNOSIS — Z113 Encounter for screening for infections with a predominantly sexual mode of transmission: Secondary | ICD-10-CM | POA: Insufficient documentation

## 2013-03-21 DIAGNOSIS — N760 Acute vaginitis: Secondary | ICD-10-CM

## 2013-03-21 LAB — POCT URINALYSIS DIP (DEVICE)
Bilirubin Urine: NEGATIVE
Ketones, ur: NEGATIVE mg/dL
Nitrite: NEGATIVE
Protein, ur: NEGATIVE mg/dL
Urobilinogen, UA: 0.2 mg/dL (ref 0.0–1.0)
pH: 6.5 (ref 5.0–8.0)

## 2013-03-21 MED ORDER — FLUCONAZOLE 150 MG PO TABS: 150.0000 mg | ORAL_TABLET | Freq: Once | ORAL | Status: DC

## 2013-03-21 MED ORDER — TERCONAZOLE 0.4 % VA CREA: 1.0000 | TOPICAL_CREAM | Freq: Every day | VAGINAL | Status: DC

## 2013-03-21 MED ORDER — CEPHALEXIN 500 MG PO CAPS: 500.0000 mg | ORAL_CAPSULE | Freq: Three times a day (TID) | ORAL | Status: DC

## 2013-03-21 NOTE — ED Notes (Addendum)
Pt c/o poss UTI onset 1 week Sxs include: itchiness, urinary freq/urgency Denies: abd pain, vag d/c, dysuria, hematuria, f/v/n/d Reports she had unprotected sex w/x-partner Also states mental health Dr has started her on Remeron 30mg  Has tried Clotrimazole 2% that has made the sxs worse Alert w/no signs of acute distress.

## 2013-03-21 NOTE — ED Provider Notes (Signed)
Chief Complaint:   Chief Complaint  Patient presents with  . Urinary Tract Infection    History of Present Illness:   Bethany Mills is a 52 year old female who's had a one-month history of vulvar irritation, she states her vulva feels raw. She's also had vulvar burning, itching, redness, and has noted some ulcerations. She's had a nonodorous vaginal discharge, mild pelvic pain, and some dysuria. She is status post hysterectomy, but does have both of her ovaries. She denies any fever, chills, nausea, vomiting, or hematuria. She does have a concern about STDs, since she did have unprotected intercourse before the symptoms began. Ever since then her partner has disappeared, and she's not sure whether he is having any symptoms are not. She tried some over-the-counter yeast cream, but this seemed to make her symptoms worse.  Review of Systems:  Other than noted above, the patient denies any of the following symptoms: Systemic:  No fever, chills, sweats, or weight loss. GI:  No abdominal pain, nausea, anorexia, vomiting, diarrhea, constipation, melena or hematochezia. GU:  No dysuria, frequency, urgency, hematuria, vaginal discharge, itching, or abnormal vaginal bleeding. Skin:  No rash or itching.  PMFSH:  Past medical history, family history, social history, meds, and allergies were reviewed.  She is allergic to Neosporin and codeine.  Physical Exam:   Vital signs:  BP 151/82  Pulse 76  Temp(Src) 98.2 F (36.8 C) (Oral)  Resp 16  SpO2 100% General:  Alert, oriented and in no distress. Lungs:  Breath sounds clear and equal bilaterally.  No wheezes, rales or rhonchi. Heart:  Regular rhythm.  No gallops or murmers. Abdomen:  Soft, flat and non-distended.  No organomegaly or mass.  No tenderness, guarding or rebound.  Bowel sounds normally active. Pelvic exam:  The vulva is erythematous, swollen, and excoriated. She has scattered, shallow ulcerations. Vaginal mucosa was normal. There was a  small amount of white, homogeneous discharge. Cervix and uterus were surgically absent. No adnexal masses or tenderness. DNA probe for gonorrhea, Chlamydia, Trichomonas, Gardnerella, Candida were obtained. Cultures of the vulva were obtained for herpes simplex. Skin:  Clear, warm and dry.  Labs:   Results for orders placed during the hospital encounter of 03/21/13  POCT URINALYSIS DIP (DEVICE)      Result Value Range   Glucose, UA NEGATIVE  NEGATIVE mg/dL   Bilirubin Urine NEGATIVE  NEGATIVE   Ketones, ur NEGATIVE  NEGATIVE mg/dL   Specific Gravity, Urine 1.020  1.005 - 1.030   Hgb urine dipstick TRACE (*) NEGATIVE   pH 6.5  5.0 - 8.0   Protein, ur NEGATIVE  NEGATIVE mg/dL   Urobilinogen, UA 0.2  0.0 - 1.0 mg/dL   Nitrite NEGATIVE  NEGATIVE   Leukocytes, UA TRACE (*) NEGATIVE  POCT URINALYSIS DIP (DEVICE)      Result Value Range   Glucose, UA NEGATIVE  NEGATIVE mg/dL   Bilirubin Urine NEGATIVE  NEGATIVE   Ketones, ur NEGATIVE  NEGATIVE mg/dL   Specific Gravity, Urine 1.010  1.005 - 1.030   Hgb urine dipstick SMALL (*) NEGATIVE   pH 7.0  5.0 - 8.0   Protein, ur NEGATIVE  NEGATIVE mg/dL   Urobilinogen, UA 0.2  0.0 - 1.0 mg/dL   Nitrite NEGATIVE  NEGATIVE   Leukocytes, UA SMALL (*) NEGATIVE    The urine was cultured.  Assessment:  The primary encounter diagnosis was Vulvovaginitis. A diagnosis of Dysuria was also pertinent to this visit.  This does not look like herpes simplex.  Candida vulvovaginitis is a possibility as is atrophic or allergic vulvovaginitis. Finally, she may also have a urinary tract infection and will treat for this as well.  Plan:   1.  Meds:  The following meds were prescribed:   Discharge Medication List as of 03/21/2013  3:46 PM    START taking these medications   Details  cephALEXin (KEFLEX) 500 MG capsule Take 1 capsule (500 mg total) by mouth 3 (three) times daily., Starting 03/21/2013, Until Discontinued, Normal    fluconazole (DIFLUCAN) 150 MG  tablet Take 1 tablet (150 mg total) by mouth once., Starting 03/21/2013, Normal    terconazole (TERAZOL 7) 0.4 % vaginal cream Place 1 applicator vaginally at bedtime., Starting 03/21/2013, Until Discontinued, Normal        2.  Patient Education/Counseling:  The patient was given appropriate handouts, self care instructions, and instructed in symptomatic relief.   3.  Follow up:  The patient was told to follow up if no better in 3 to 4 days, if becoming worse in any way, and given some red flag symptoms such as increasing pelvic pain which would prompt immediate return.  Follow up here as necessary.     Reuben Likes, MD 03/21/13 [phone]

## 2013-03-21 NOTE — ED Notes (Signed)
Call back number verified.  

## 2013-03-24 ENCOUNTER — Telehealth (HOSPITAL_COMMUNITY): Payer: Self-pay | Admitting: Emergency Medicine

## 2013-03-24 MED ORDER — METRONIDAZOLE 500 MG PO TABS: 500.0000 mg | ORAL_TABLET | Freq: Two times a day (BID) | ORAL | Status: DC

## 2013-03-24 NOTE — Telephone Encounter (Signed)
Message copied by Reuben Likes on Mon Mar 24, 2013 10:31 PM ------      Message from: Vassie Moselle      Created: Mon Mar 24, 2013 10:24 PM      Regarding: labs       Gardnerella pos. Treated with Diflucan and Terazol.  Urine culture: >100,000 colonies E. Coli. Pt. adequately treated with Keflex, but it also says Staph Aureus but not sens. done on that one.  I was wondering why. Rest of labs neg.      Vassie Moselle      03/24/2013       ------

## 2013-03-24 NOTE — ED Notes (Signed)
DNA probe for Gardnerella was positive. Will need treatment with Flagyl. Prescription to be called in for her pharmacy for Flagyl 500 mg #14, 1 twice a day for one week.  Reuben Likes, MD 03/24/13 [phone]

## 2013-03-25 ENCOUNTER — Telehealth (HOSPITAL_COMMUNITY): Payer: Self-pay | Admitting: *Deleted

## 2013-03-25 LAB — URINE CULTURE: Special Requests: NORMAL

## 2013-03-25 NOTE — ED Notes (Addendum)
GC/Chlamydia neg., Affirm: Candida and Trich neg., Gardnerella pos., Urine culture: >100,000 colonies E. Coli and Staph Aureus, Herpes culture pending.  Pt. adequately treated with Keflex for UTI and needs Flagyl for bacterial vaginosis.  Dr. Lorenz Coaster e-prescribed it to the [facility] in [place]. I called pt. Pt. verified x 2 and given results.  Pt. told I would call if Herpes test is positive.  Pt. told to finish all of Keflex and to take Flagyl for bacterial vaginosis.  Pt. instructed to no alcohol while taking this medication.  Pt. told where to pick up Rx. Pt. said she the rash is going down her leg and itching. Dr. Lorenz Coaster said she can try Hydrocortisone 1% ointment. If no improvement she needs to come back. Pt. notified.  Pt. voiced understanding. Vassie Moselle 03/25/2013 Herpes culture: No Herpes Simplex detected. 03/26/2013

## 2013-03-26 LAB — HERPES SIMPLEX VIRUS CULTURE: Culture: NOT DETECTED

## 2013-04-07 ENCOUNTER — Emergency Department (INDEPENDENT_AMBULATORY_CARE_PROVIDER_SITE_OTHER)
Admission: EM | Admit: 2013-04-07 | Discharge: 2013-04-07 | Disposition: A | Discharge status: Home / Self Care | Payer: Self-pay | Source: Home / Self Care | Attending: Emergency Medicine | Admitting: Emergency Medicine

## 2013-04-07 ENCOUNTER — Encounter (HOSPITAL_COMMUNITY): Payer: Self-pay | Admitting: Emergency Medicine

## 2013-04-07 ENCOUNTER — Other Ambulatory Visit (HOSPITAL_COMMUNITY)
Admission: RE | Admit: 2013-04-07 | Discharge: 2013-04-07 | Disposition: A | Discharge status: Home / Self Care | Payer: Disability Insurance | Source: Ambulatory Visit | Attending: Emergency Medicine | Admitting: Emergency Medicine

## 2013-04-07 DIAGNOSIS — Z113 Encounter for screening for infections with a predominantly sexual mode of transmission: Secondary | ICD-10-CM | POA: Insufficient documentation

## 2013-04-07 DIAGNOSIS — N760 Acute vaginitis: Secondary | ICD-10-CM | POA: Insufficient documentation

## 2013-04-07 DIAGNOSIS — L259 Unspecified contact dermatitis, unspecified cause: Secondary | ICD-10-CM

## 2013-04-07 LAB — POCT URINALYSIS DIP (DEVICE)
Bilirubin Urine: NEGATIVE
Glucose, UA: NEGATIVE mg/dL
Nitrite: NEGATIVE
Urobilinogen, UA: 0.2 mg/dL (ref 0.0–1.0)
pH: 7 (ref 5.0–8.0)

## 2013-04-07 MED ORDER — HYDROCORTISONE 1 % EX CREA: TOPICAL_CREAM | CUTANEOUS | Status: DC

## 2013-04-07 MED ORDER — METHYLPREDNISOLONE ACETATE 80 MG/ML IJ SUSP
80.0000 mg | Freq: Once | INTRAMUSCULAR | Status: AC
  Administered 2013-04-07: 80 mg via INTRAMUSCULAR

## 2013-04-07 MED ORDER — PREDNISONE 20 MG PO TABS: ORAL_TABLET | ORAL | Status: DC

## 2013-04-07 MED ORDER — METHYLPREDNISOLONE ACETATE 80 MG/ML IJ SUSP
INTRAMUSCULAR | Status: AC
  Filled 2013-04-07: qty 1

## 2013-04-07 MED ORDER — METHYLPREDNISOLONE ACETATE 80 MG/ML IJ SUSP: 80.0000 mg | Freq: Once | INTRAMUSCULAR | Status: DC

## 2013-04-07 NOTE — ED Provider Notes (Signed)
Chief Complaint:   Chief Complaint  Patient presents with  . Follow-up    History of Present Illness:   Bethany Mills is a 52 year old female who presents with continued vulvar itching. She was here in November 14, about 2 weeks ago. She had diffuse erythema of the vulva. It was thought that this might be due to Candida. 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 She was given metronidazole 14 tablets, 1 twice a day. The symptoms seemed to get better, but then over the past 3 days got worse again with itching, vaginal discharge, rash around the vulva and around the anus, and a rectal discharge. She also has an itchy rash on her face, arms, legs, and trunk and some peeling of her hands. She has cracking of her lips and some sores in his abdominal pain. 4 days ago she had some nausea and vomiting and diarrhea. She also has dysuria. She denies any contactants, allergens, or antigens. She's not using any of them in hygiene products, no change in soaps, she was wearing some new underwear. She denies use of any creams, douches, or any other obvious antigens.  Review of Systems:  Other than noted above, the patient denies any of the following symptoms: Systemic:  No fever, chills, sweats, or weight loss. GI:  No abdominal pain, nausea, anorexia, vomiting, diarrhea, constipation, melena or hematochezia. GU:  No dysuria, frequency, urgency, hematuria, vaginal discharge, itching, or abnormal vaginal bleeding. Skin:  No rash or itching.  PMFSH:  Past medical history, family history, social history, meds, and allergies were reviewed.  She is allergic to codeine and Neosporin. Her only medication right now Celexa.  Physical Exam:   Vital signs:  BP 146/87  Pulse 82  Temp(Src) 98.2 F (36.8 C) (Oral)  Resp 16 General:  Alert, oriented and in no distress. Lungs:  Breath  sounds clear and equal bilaterally.  No wheezes, rales or rhonchi. Heart:  Regular rhythm.  No gallops or murmers. Abdomen:  Soft, flat and non-distended.  No organomegaly or mass.  No tenderness, guarding or rebound.  Bowel sounds normally active. Pelvic exam:  There is diffuse erythema of the entire vulva extending to the medial thighs, to the pubis, and to the anus. There is some blistering and excoriation and cracking of the skin. Vaginal mucosa was atrophic. There was a very small amount of vaginal discharge. Cervix and uterus were surgically absent. No pelvic or adnexal pain to palpation. Skin:  She has scattered erythematous patches on her arms and legs. There is nothing visible on her trunk or face. She has diffuse peeling of her hands.  Course in Urgent Care Center:   Given Depo-Medrol 80 mg IM.  Assessment:  The encounter diagnosis was Contact dermatitis.  I'm not certain what the allergen is. It's possible it could be something or clothing.  Plan:   1.  Meds:  The following meds were prescribed:   Discharge Medication List as of 04/07/2013  3:45 PM    START taking these medications   Details  hydrocortisone cream 1 % Apply to affected area 4 times daily, Normal    predniSONE (DELTASONE) 20 MG tablet 3 daily for 5 days, 2 daily for 5 days, 1 daily for 5 days., Normal        2.  Patient Education/Counseling:  The patient was given appropriate handouts, self care instructions, and  instructed in symptomatic relief.  She'll avoid contact with any possible allergens or antigens including soaps, detergents, dryer sheets, fabric softener, or new clothing.  3.  Follow up:  The patient was told to follow up if no better in 3 to 4 days, if becoming worse in any way, and given some red flag symptoms such as worsening rash which would prompt immediate return.  Follow up with Dr. Para Skeans as soon as possible.     Reuben Likes, MD 04/07/13 [id number]

## 2013-04-07 NOTE — ED Notes (Signed)
Follow up from UTI and blisters on buttocks

## 2013-04-08 LAB — URINE CULTURE: Special Requests: NORMAL

## 2014-09-07 ENCOUNTER — Emergency Department (INDEPENDENT_AMBULATORY_CARE_PROVIDER_SITE_OTHER)
Admission: EM | Admit: 2014-09-07 | Discharge: 2014-09-07 | Disposition: A | Discharge status: Home / Self Care | Payer: Self-pay | Source: Home / Self Care | Attending: Family Medicine | Admitting: Family Medicine

## 2014-09-07 ENCOUNTER — Encounter (HOSPITAL_COMMUNITY): Payer: Self-pay

## 2014-09-07 DIAGNOSIS — J9801 Acute bronchospasm: Secondary | ICD-10-CM

## 2014-09-07 DIAGNOSIS — F17200 Nicotine dependence, unspecified, uncomplicated: Secondary | ICD-10-CM

## 2014-09-07 DIAGNOSIS — J302 Other seasonal allergic rhinitis: Secondary | ICD-10-CM

## 2014-09-07 DIAGNOSIS — R0982 Postnasal drip: Secondary | ICD-10-CM

## 2014-09-07 MED ORDER — ALBUTEROL SULFATE (2.5 MG/3ML) 0.083% IN NEBU
2.5000 mg | INHALATION_SOLUTION | Freq: Once | RESPIRATORY_TRACT | Status: AC
  Administered 2014-09-07: 2.5 mg via RESPIRATORY_TRACT

## 2014-09-07 MED ORDER — PREDNISONE 20 MG PO TABS
ORAL_TABLET | ORAL | Status: DC
Start: 2014-09-07 — End: 2017-06-13

## 2014-09-07 MED ORDER — IPRATROPIUM BROMIDE 0.06 % NA SOLN: 2.0000 | Freq: Four times a day (QID) | NASAL | Status: DC

## 2014-09-07 MED ORDER — IPRATROPIUM-ALBUTEROL 0.5-2.5 (3) MG/3ML IN SOLN
3.0000 mL | Freq: Once | RESPIRATORY_TRACT | Status: AC
  Administered 2014-09-07: 3 mL via RESPIRATORY_TRACT

## 2014-09-07 MED ORDER — TRIAMCINOLONE ACETONIDE 40 MG/ML IJ SUSP
INTRAMUSCULAR | Status: AC
  Filled 2014-09-07: qty 1

## 2014-09-07 MED ORDER — IPRATROPIUM-ALBUTEROL 0.5-2.5 (3) MG/3ML IN SOLN
RESPIRATORY_TRACT | Status: AC
  Filled 2014-09-07: qty 3

## 2014-09-07 MED ORDER — ALBUTEROL SULFATE (2.5 MG/3ML) 0.083% IN NEBU
INHALATION_SOLUTION | RESPIRATORY_TRACT | Status: AC
  Filled 2014-09-07: qty 3

## 2014-09-07 MED ORDER — ALBUTEROL SULFATE HFA 108 (90 BASE) MCG/ACT IN AERS: 2.0000 | INHALATION_SPRAY | RESPIRATORY_TRACT | Status: DC | PRN

## 2014-09-07 MED ORDER — TRIAMCINOLONE ACETONIDE 40 MG/ML IJ SUSP
40.0000 mg | Freq: Once | INTRAMUSCULAR | Status: AC
  Administered 2014-09-07: 40 mg via INTRAMUSCULAR

## 2014-09-07 NOTE — ED Notes (Signed)
States she feels better.

## 2014-09-07 NOTE — Discharge Instructions (Signed)
Allergic Rhinitis Fonase nasal spray Use lots of saline nasal spray frequently Sudafed PE 10 mg for congestion Zyrtec or Allegra for drainage Robitussin DM for cough  Allergic rhinitis is when the mucous membranes in the nose respond to allergens. Allergens are particles in the air that cause your body to have an allergic reaction. This causes you to release allergic antibodies. Through a chain of events, these eventually cause you to release histamine into the blood stream. Although meant to protect the body, it is this release of histamine that causes your discomfort, such as frequent sneezing, congestion, and an itchy, runny nose.  CAUSES  Seasonal allergic rhinitis (hay fever) is caused by pollen allergens that may come from grasses, trees, and weeds. Year-round allergic rhinitis (perennial allergic rhinitis) is caused by allergens such as house dust mites, pet dander, and mold spores.  SYMPTOMS   Nasal stuffiness (congestion).  Itchy, runny nose with sneezing and tearing of the eyes. DIAGNOSIS  Your health care provider can help you determine the allergen or allergens that trigger your symptoms. If you and your health care provider are unable to determine the allergen, skin or blood testing may be used. TREATMENT  Allergic rhinitis does not have a cure, but it can be controlled by:  Medicines and allergy shots (immunotherapy).  Avoiding the allergen. Hay fever may often be treated with antihistamines in pill or nasal spray forms. Antihistamines block the effects of histamine. There are over-the-counter medicines that may help with nasal congestion and swelling around the eyes. Check with your health care provider before taking or giving this medicine.  If avoiding the allergen or the medicine prescribed do not work, there are many new medicines your health care provider can prescribe. Stronger medicine may be used if initial measures are ineffective. Desensitizing injections can be used  if medicine and avoidance does not work. Desensitization is when a patient is given ongoing shots until the body becomes less sensitive to the allergen. Make sure you follow up with your health care provider if problems continue. HOME CARE INSTRUCTIONS It is not possible to completely avoid allergens, but you can reduce your symptoms by taking steps to limit your exposure to them. It helps to know exactly what you are allergic to so that you can avoid your specific triggers. SEEK MEDICAL CARE IF:   You have a fever.  You develop a cough that does not stop easily (persistent).  You have shortness of breath.  You start wheezing.  Symptoms interfere with normal daily activities. Document Released: 01/17/2001 Document Revised: 04/29/2013 Document Reviewed: 12/30/2012 [facility] Patient Information 2015 [place], [place]. This information is not intended to replace advice given to you by your health care provider. Make sure you discuss any questions you have with your health care provider.  Bronchospasm Albuterol Inhaler and Prednisone taper Stop smoking A bronchospasm is a spasm or tightening of the airways going into the lungs. During a bronchospasm breathing becomes more difficult because the airways get smaller. When this happens there can be coughing, a whistling sound when breathing (wheezing), and difficulty breathing. Bronchospasm is often associated with asthma, but not all patients who experience a bronchospasm have asthma. CAUSES  A bronchospasm is caused by inflammation or irritation of the airways. The inflammation or irritation may be triggered by:   Allergies (such as to animals, pollen, food, or mold). Allergens that cause bronchospasm may cause wheezing immediately after exposure or many hours later.   Infection. Viral infections are believed to be the  most common cause of bronchospasm.   Exercise.   Irritants (such as pollution, cigarette smoke, strong odors, aerosol sprays,  and paint fumes).   Weather changes. Winds increase molds and pollens in the air. Rain refreshes the air by washing irritants out. Cold air may cause inflammation.   Stress and emotional upset.  SIGNS AND SYMPTOMS   Wheezing.   Excessive nighttime coughing.   Frequent or severe coughing with a simple cold.   Chest tightness.   Shortness of breath.  DIAGNOSIS  Bronchospasm is usually diagnosed through a history and physical exam. Tests, such as chest X-rays, are sometimes done to look for other conditions. TREATMENT   Inhaled medicines can be given to open up your airways and help you breathe. The medicines can be given using either an inhaler or a nebulizer machine.  Corticosteroid medicines may be given for severe bronchospasm, usually when it is associated with asthma. HOME CARE INSTRUCTIONS   Always have a plan prepared for seeking medical care. Know when to call your health care provider and local emergency services (911 in the [place] Know where you can access local emergency care.  Only take medicines as directed by your health care provider.  If you were prescribed an inhaler or nebulizer machine, ask your health care provider to explain how to use it correctly. Always use a spacer with your inhaler if you were given one.  It is necessary to remain calm during an attack. Try to relax and breathe more slowly.  Control your home environment in the following ways:   Change your heating and air conditioning filter at least once a month.   Limit your use of fireplaces and wood stoves.  Do not smoke and do not allow smoking in your home.   Avoid exposure to perfumes and fragrances.   Get rid of pests (such as roaches and mice) and their droppings.   Throw away plants if you see mold on them.   Keep your house clean and dust free.   Replace carpet with wood, tile, or vinyl flooring. Carpet can trap dander and dust.   Use allergy-proof pillows,  mattress covers, and box spring covers.   Wash bed sheets and blankets every week in hot water and dry them in a dryer.   Use blankets that are made of polyester or cotton.   Wash hands frequently. SEEK MEDICAL CARE IF:   You have muscle aches.   You have chest pain.   The sputum changes from clear or white to yellow, green, gray, or bloody.   The sputum you cough up gets thicker.   There are problems that may be related to the medicine you are given, such as a rash, itching, swelling, or trouble breathing.  SEEK IMMEDIATE MEDICAL CARE IF:   You have worsening wheezing and coughing even after taking your prescribed medicines.   You have increased difficulty breathing.   You develop severe chest pain. MAKE SURE YOU:   Understand these instructions.  Will watch your condition.  Will get help right away if you are not doing well or get worse. Document Released: 04/27/2003 Document Revised: 04/29/2013 Document Reviewed: 10/14/2012 [facility] Patient Information 2015 [place]. This information is not intended to replace advice given to you by your health care provider. Make sure you discuss any questions you have with your health care provider.  How to Use an Inhaler Using your inhaler correctly is very important. Good technique will make sure that the medicine reaches your  lungs.  HOW TO USE AN INHALER:  Take the cap off the inhaler.  If this is the first time using your inhaler, you need to prime it. Shake the inhaler for 5 seconds. Release four puffs into the air, away from your face. Ask your doctor for help if you have questions.  Shake the inhaler for 5 seconds.  Turn the inhaler so the bottle is above the mouthpiece.  Put your pointer finger on top of the bottle. Your thumb holds the bottom of the inhaler.  Open your mouth.  Either hold the inhaler away from your mouth (the width of 2 fingers) or place your lips tightly around the mouthpiece. Ask  your doctor which way to use your inhaler.  Breathe out as much air as possible.  Breathe in and push down on the bottle 1 time to release the medicine. You will feel the medicine go in your mouth and throat.  Continue to take a deep breath in very slowly. Try to fill your lungs.  After you have breathed in completely, hold your breath for 10 seconds. This will help the medicine to settle in your lungs. If you cannot hold your breath for 10 seconds, hold it for as long as you can before you breathe out.  Breathe out slowly, through pursed lips. Whistling is an example of pursed lips.  If your doctor has told you to take more than 1 puff, wait at least 15-30 seconds between puffs. This will help you get the best results from your medicine. Do not use the inhaler more than your doctor tells you to.  Put the cap back on the inhaler.  Follow the directions from your doctor or from the inhaler package about cleaning the inhaler. If you use more than one inhaler, ask your doctor which inhalers to use and what order to use them in. Ask your doctor to help you figure out when you will need to refill your inhaler.  If you use a steroid inhaler, always rinse your mouth with water after your last puff, gargle and spit out the water. Do not swallow the water. GET HELP IF:  The inhaler medicine only partially helps to stop wheezing or shortness of breath.  You are having trouble using your inhaler.  You have some increase in thick spit (phlegm). GET HELP RIGHT AWAY IF:  The inhaler medicine does not help your wheezing or shortness of breath or you have tightness in your chest.  You have dizziness, headaches, or fast heart rate.  You have chills, fever, or night sweats.  You have a large increase of thick spit, or your thick spit is bloody. MAKE SURE YOU:   Understand these instructions.  Will watch your condition.  Will get help right away if you are not doing well or get worse. Document  Released: 02/01/2008 Document Revised: 02/12/2013 Document Reviewed: 11/21/2012 [facility] Patient Information 2015 [place], [place]. This information is not intended to replace advice given to you by your health care provider. Make sure you discuss any questions you have with your health care provider.  Sinusitis Sinusitis is redness, soreness, and puffiness (inflammation) of the air pockets in the bones of your face (sinuses). The redness, soreness, and puffiness can cause air and mucus to get trapped in your sinuses. This can allow germs to grow and cause an infection.  HOME CARE   Drink enough fluids to keep your pee (urine) clear or pale yellow.  Use a humidifier in your home.  Run a hot shower to create steam in the bathroom. Sit in the bathroom with the door closed. Breathe in the steam 3-4 times a day.  Put a warm, moist washcloth on your face 3-4 times a day, or as told by your doctor.  Use salt water sprays (saline sprays) to wet the thick fluid in your nose. This can help the sinuses drain.  Only take medicine as told by your doctor. GET HELP RIGHT AWAY IF:   Your pain gets worse.  You have very bad headaches.  You are sick to your stomach (nauseous).  You throw up (vomit).  You are very sleepy (drowsy) all the time.  Your face is puffy (swollen).  Your vision changes.  You have a stiff neck.  You have trouble breathing. MAKE SURE YOU:   Understand these instructions.  Will watch your condition.  Will get help right away if you are not doing well or get worse. Document Released: 10/11/2007 Document Revised: 01/17/2012 Document Reviewed: 11/28/2011 [facility] Patient Information 2015 [place], [place]. This information is not intended to replace advice given to you by your health care provider. Make sure you discuss any questions you have with your health care provider.  Nicotine Addiction Nicotine can act as both a stimulant (excites/activates) and a sedative  (calms/quiets). Immediately after exposure to nicotine, there is a "kick" caused in part by the drug's stimulation of the adrenal glands and resulting discharge of adrenaline (epinephrine). The rush of adrenaline stimulates the body and causes a sudden release of sugar. This means that smokers are always slightly hyperglycemic. Hyperglycemic means that the blood sugar is high, just like in diabetics. Nicotine also decreases the amount of insulin which helps control sugar levels in the body. There is an increase in blood pressure, breathing, and the rate of heart beats.  In addition, nicotine indirectly causes a release of dopamine in the brain that controls pleasure and motivation. A similar reaction is seen with other drugs of abuse, such as cocaine and heroin. This dopamine release is thought to cause the pleasurable sensations when smoking. In some different cases, nicotine can also create a calming effect, depending on sensitivity of the smoker's nervous system and the dose of nicotine taken. WHAT HAPPENS WHEN NICOTINE IS TAKEN FOR LONG PERIODS OF TIME?  Long-term use of nicotine results in addiction. It is difficult to stop.  Repeated use of nicotine creates tolerance. Higher doses of nicotine are needed to get the "kick." When nicotine use is stopped, withdrawal may last a month or more. Withdrawal may begin within a few hours after the last cigarette. Symptoms peak within the first few days and may lessen within a few weeks. For some people, however, symptoms may last for months or longer. Withdrawal symptoms include:   Irritability.  Craving.  Learning and attention deficits.  Sleep disturbances.  Increased appetite. Craving for tobacco may last for 6 months or longer. Many behaviors done while using nicotine can also play a part in the severity of withdrawal symptoms. For some people, the feel, smell, and sight of a cigarette and the ritual of obtaining, handling, lighting, and smoking the  cigarette are closely linked with the pleasure of smoking. When stopped, they also miss the related behaviors which make the withdrawal or craving worse. While nicotine gum and patches may lessen the drug aspects of withdrawal, cravings often persist. WHAT ARE THE MEDICAL CONSEQUENCES OF NICOTINE USE?  Nicotine addiction accounts for one-third of all cancers. The top cancer caused by  tobacco is lung cancer. Lung cancer is the number one cancer killer of both men and women.  Smoking is also associated with cancers of the:  Mouth.  Pharynx.  Larynx.  Esophagus.  Stomach.  Pancreas.  Cervix.  Kidney.  Ureter.  Bladder.  Smoking also causes lung diseases such as lasting (chronic) bronchitis and emphysema.  It worsens asthma in adults and children.  Smoking increases the risk of heart disease, including:  Stroke.  Heart attack.  Vascular disease.  Aneurysm.  Passive or secondary smoke can also increase medical risks including:  Asthma in children.  Sudden Infant Death Syndrome (SIDS).  Additionally, dropped cigarettes are the leading cause of residential fire fatalities.  Nicotine poisoning has been reported from accidental ingestion of tobacco products by children and pets. Death usually results in a few minutes from respiratory failure (when a person stops breathing) caused by paralysis. TREATMENT   Medication. Nicotine replacement medicines such as nicotine gum and the patch are used to stop smoking. These medicines gradually lower the dosage of nicotine in the body. These medicines do not contain the carbon monoxide and other toxins found in tobacco smoke.  Hypnotherapy.  Relaxation therapy.  Nicotine Anonymous (a 12-step support program). Find times and locations in your local yellow pages. Document Released: 12/29/2003 Document Revised: 07/17/2011 Document Reviewed: 06/20/2013 [facility] Patient Information 2015 [place], [place]. This information is not  intended to replace advice given to you by your health care provider. Make sure you discuss any questions you have with your health care provider.

## 2014-09-07 NOTE — ED Notes (Signed)
C/o 10 day duration of facial pain. Minimal relief w mucinex

## 2014-09-07 NOTE — ED Provider Notes (Signed)
CSN: [id number]     Arrival date & time 09/07/14  1219 History   First MD Initiated Contact with Patient 09/07/14 1408     Chief Complaint  Patient presents with  . Facial Pain   (Consider location/radiation/quality/duration/timing/severity/associated sxs/prior Treatment) HPI Comments: 54 year old female appearing older than stated age is complaining of head congestion and chest congestion, ears being stopped up, runny nose, PND, stuffy nose and decreased hearing. She denies documented fevers, sore throat or chest pain. She does have shortness of breath. She is a smoker for many years.   Past Medical History  Diagnosis Date  . Hypertension   . Anxiety    Past Surgical History  Procedure Laterality Date  . Abdominal hysterectomy     History reviewed. No pertinent family history. History  Substance Use Topics  . Smoking status: Current Every Day Smoker    Types: Cigarettes  . Smokeless tobacco: Not on file  . Alcohol Use: No   OB History    No data available     Review of Systems  Constitutional: Positive for activity change. Negative for fever.  HENT: Positive for congestion, ear pain, postnasal drip, rhinorrhea and sinus pressure. Negative for ear discharge and sore throat.   Eyes: Negative.   Respiratory: Positive for cough and shortness of breath.   Cardiovascular: Negative for chest pain and leg swelling.  Gastrointestinal: Negative.   Genitourinary: Negative.   Skin: Negative.   Neurological: Negative.     Allergies  Codeine; Neomycin-polymyxin b gu; and Neomycin-bacitracin zn-polymyx  Home Medications   Prior to Admission medications   Medication Sig Start Date End Date Taking? Authorizing Provider  acetaminophen (TYLENOL) 500 MG tablet Take 2,000 mg by mouth every 6 (six) hours as needed. Pain     Historical Provider, MD  albuterol (PROVENTIL HFA;VENTOLIN HFA) 108 (90 BASE) MCG/ACT inhaler Inhale 2 puffs into the lungs every 4 (four) hours as needed for  wheezing or shortness of breath. 09/07/14   Hayden Rasmussen, NP  diazepam (VALIUM) 10 MG tablet Take 10 mg by mouth every 6 (six) hours as needed for anxiety.    Historical Provider, MD  hydrocortisone cream 1 % Apply to affected area 4 times daily 04/07/13   Reuben Likes, MD  ibuprofen (ADVIL,MOTRIN) 200 MG tablet Take 400 mg by mouth every 6 (six) hours as needed for pain.    Historical Provider, MD  ipratropium (ATROVENT) 0.06 % nasal spray Place 2 sprays into both nostrils 4 (four) times daily. 09/07/14   Hayden Rasmussen, NP  mirtazapine (REMERON) 30 MG tablet Take 30 mg by mouth at bedtime.    Historical Provider, MD  potassium chloride SA (K-DUR,KLOR-CON) 20 MEQ tablet Take 1 tablet (20 mEq total) by mouth 2 (two) times daily. 10/25/12   Devoria Albe, MD  predniSONE (DELTASONE) 20 MG tablet Take 3 tabs po on first day, 2 tabs second day, 2 tabs third day, 1 tab fourth day, 1 tab 5th day. Take with food. 09/07/14   Hayden Rasmussen, NP   BP 155/95 mmHg  Pulse 71  Temp(Src) 98.2 F (36.8 C) (Oral)  Resp 14  SpO2 98% Physical Exam  Constitutional: She is oriented to person, place, and time. She appears well-developed and well-nourished. No distress.  HENT:  Mouth/Throat: No oropharyngeal exudate.  Bilateral TMs are retracted. No erythema or effusion. Oropharynx with mild erythema, cobblestoning, thick, clear to tan PND. Brownish coloring to the surface of the time most likely due to smoking.  Eyes: Conjunctivae and  EOM are normal.  Neck: Normal range of motion. Neck supple.  Cardiovascular: Normal rate, regular rhythm and normal heart sounds.   Pulmonary/Chest: Effort normal. She has wheezes.  Bilateral breath sounds decreased. Expiratory phase prolonged. Coarseness with forced expiration and cough.  Musculoskeletal: She exhibits no edema.  Lymphadenopathy:    She has no cervical adenopathy.  Neurological: She is alert and oriented to person, place, and time.  Skin: Skin is warm.  Psychiatric: She has a  normal mood and affect.  Nursing note and vitals reviewed.   ED Course  Procedures (including critical care time) Labs Review Labs Reviewed - No data to display  Imaging Review No results found.   MDM   1. Other seasonal allergic rhinitis   2. PND (post-nasal drip)   3. Bronchospasm   4. Tobacco dependence    Patient states she feels much better after the DuoNeb, 5 mg/2.5 mg. She has improved air movement and decreased coarseness and wheeze. Albuterol HFA as directed Prednisone taper dose Kenalog 40 mg IM. Fonase nasal spray Use lots of saline nasal spray frequently Sudafed PE 10 mg for congestion Zyrtec or Allegra for drainage Robitussin DM for cough Need to follow-up with PCP, obtain one as soon as possible.   Hayden Rasmussenavid Sussan Meter, NP 09/07/14 [id number]

## 2014-11-06 IMAGING — CR DG KNEE 1-2V*R*
2 series · 2 of 2 positions shown · non-contrast
Comparison: None.

CLINICAL DATA: Pain.

RIGHT KNEE - 1-2 VIEW

[view not recorded (1 of 2)]
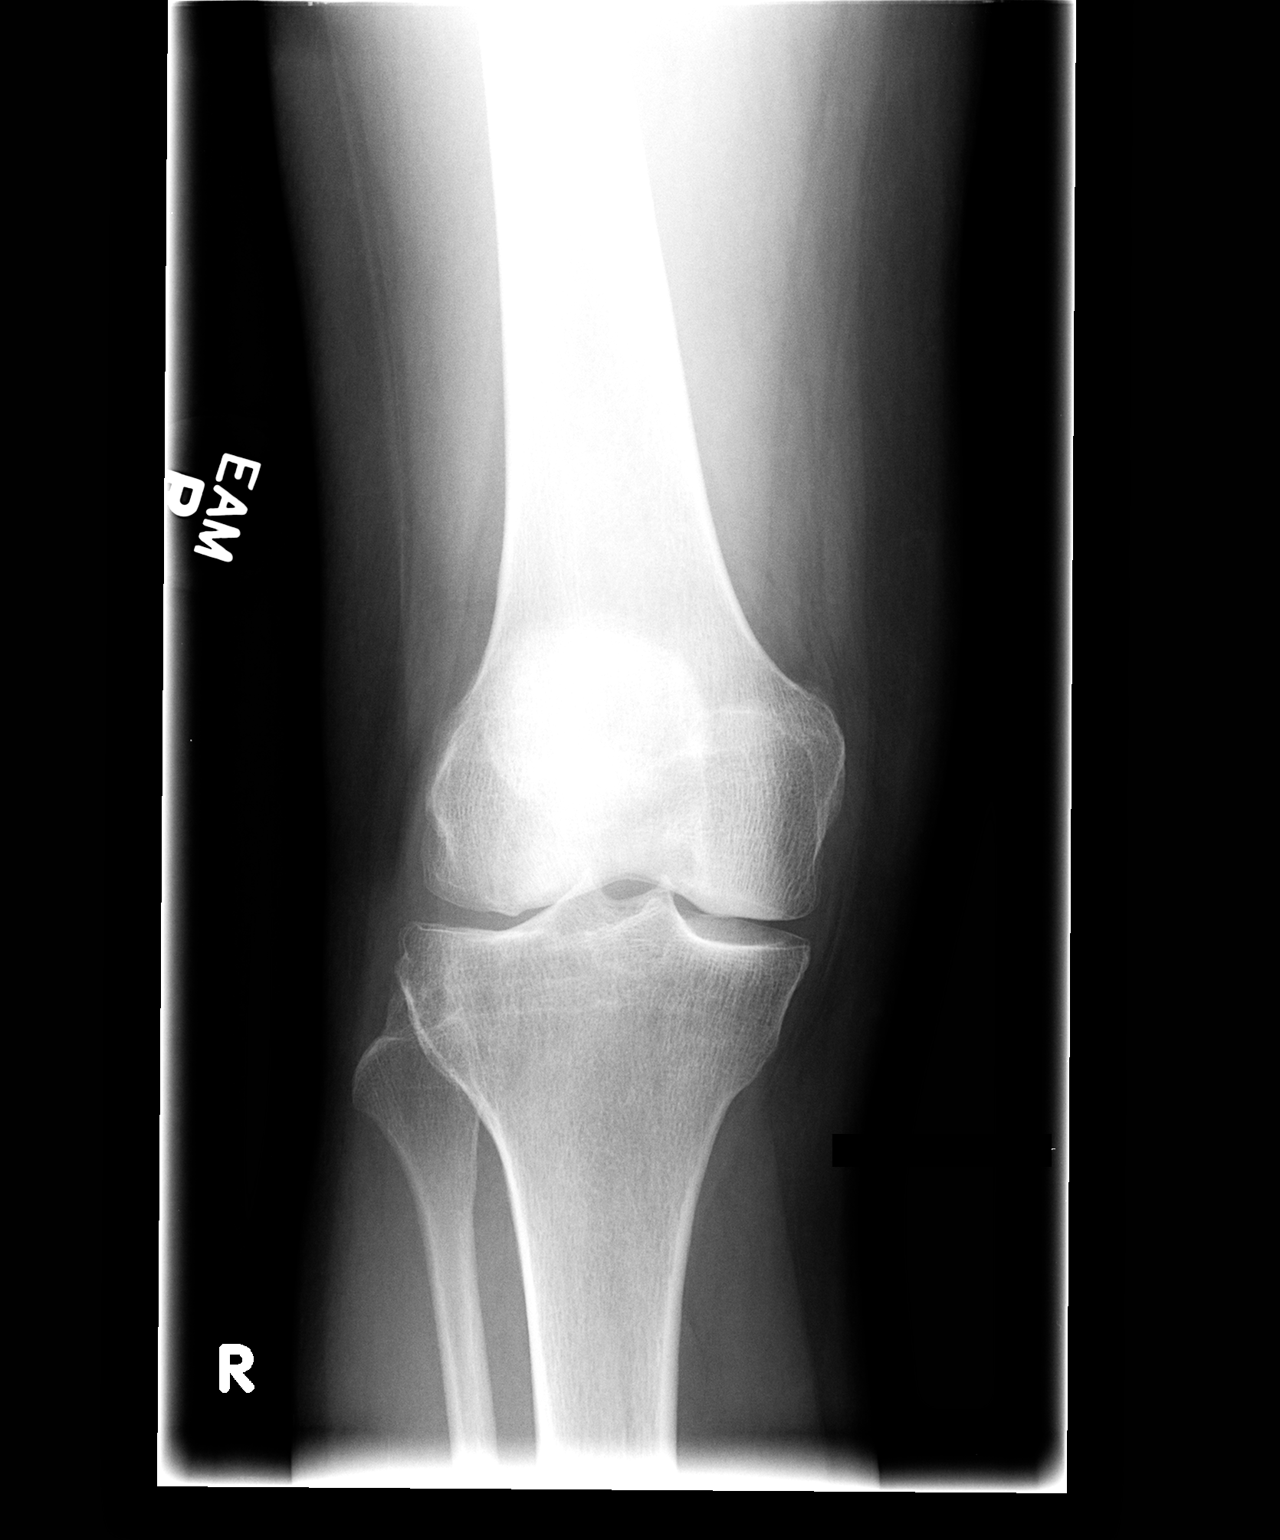

[view not recorded (2 of 2)]
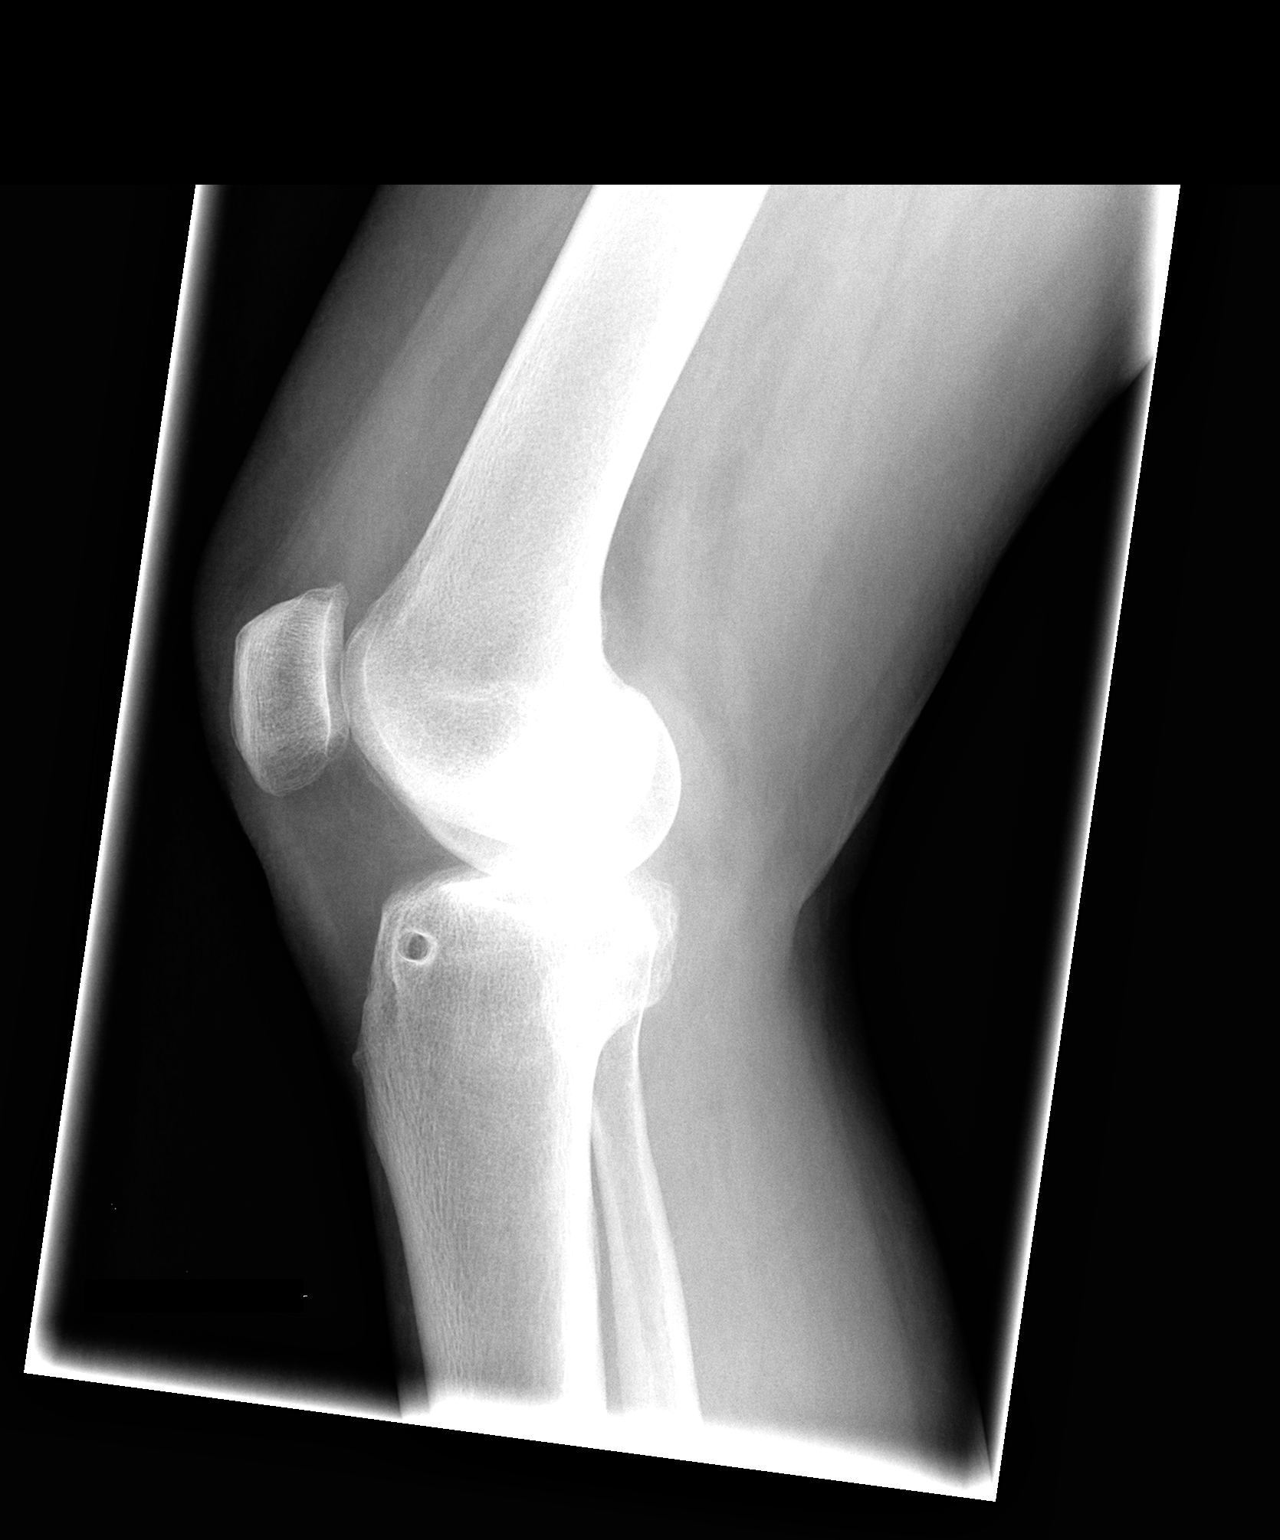

[2 of 2 positions shown; findings below may reference images not displayed]

FINDINGS: Postoperative changes are seen in the proximal tibia.  No
joint effusion or fracture.  Minimal peaking of the tibial spines.
IMPRESSION: Minimal peaking of the tibial spines.  No acute findings.

## 2014-11-29 IMAGING — CR DG CHEST 2V
2 series · 2 of 2 positions shown · non-contrast
Comparison: Prior chest x-ray 02/17/2010

CLINICAL DATA: Hemoptysis, smoker

CHEST - 2 VIEW

[view not recorded (1 of 2)]
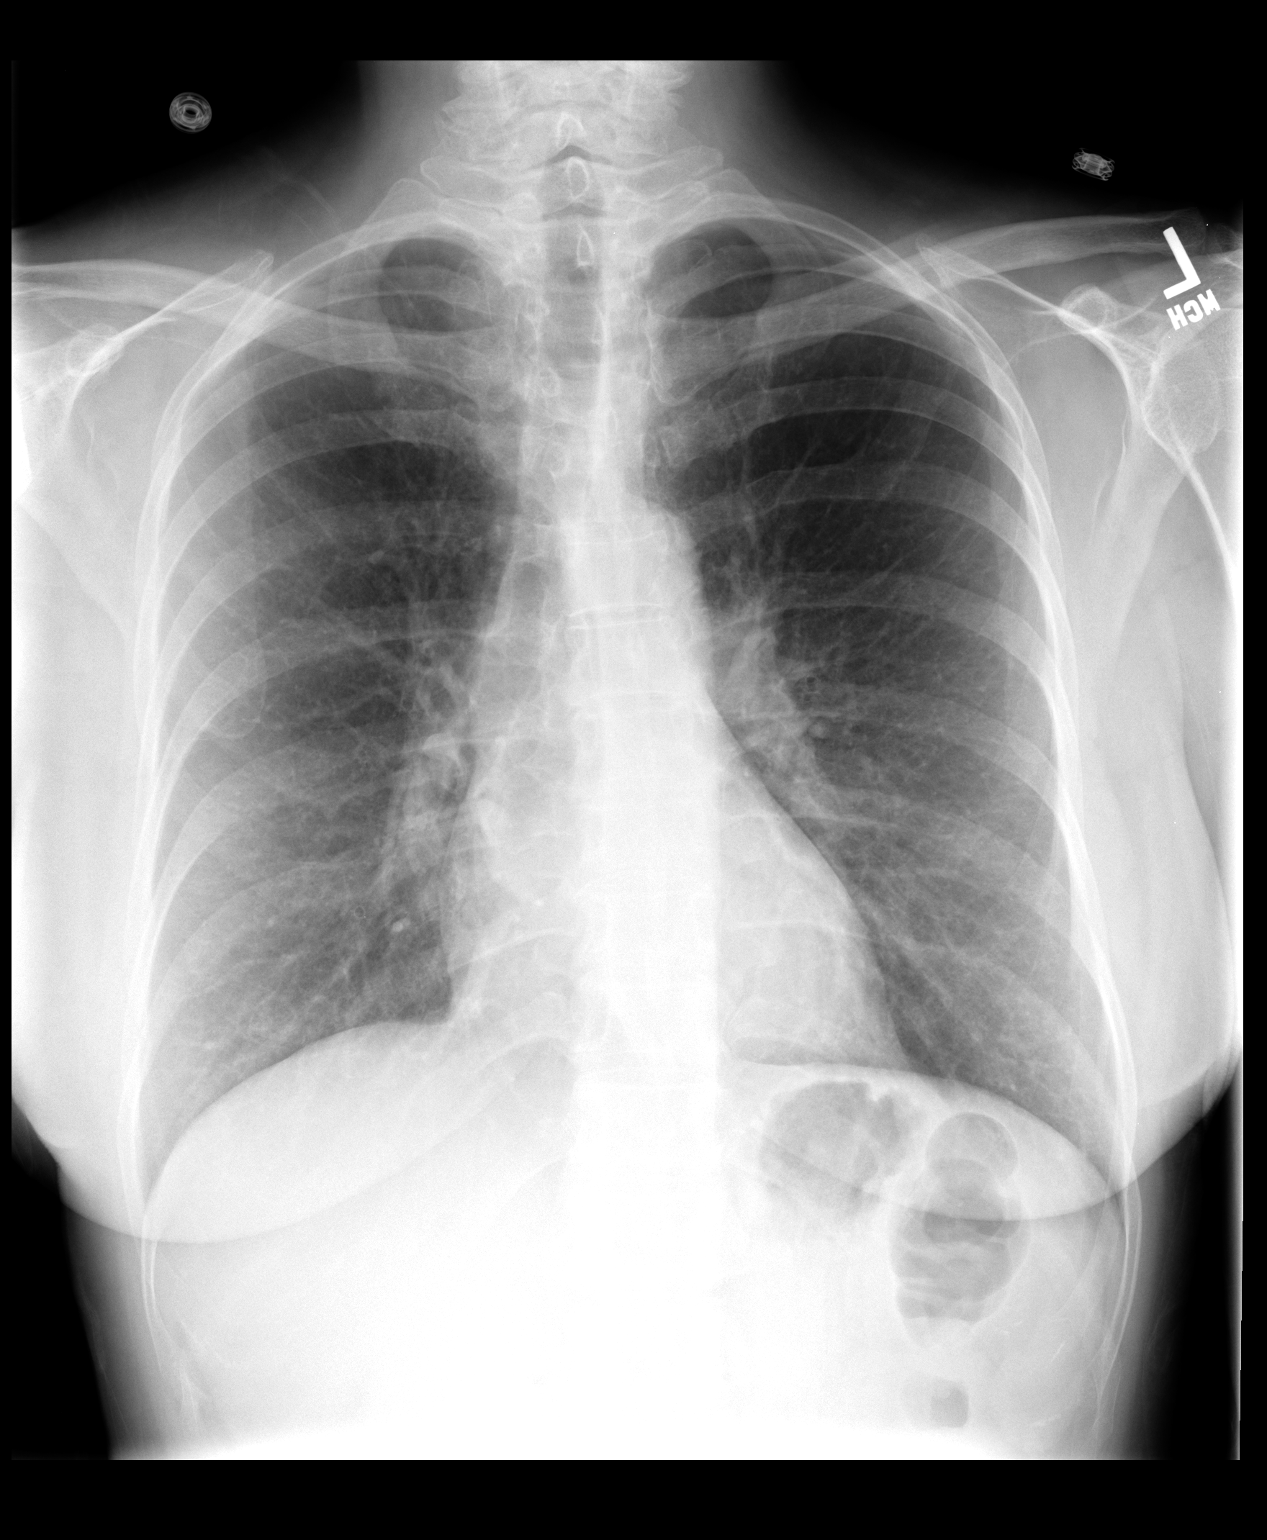

[view not recorded (2 of 2)]
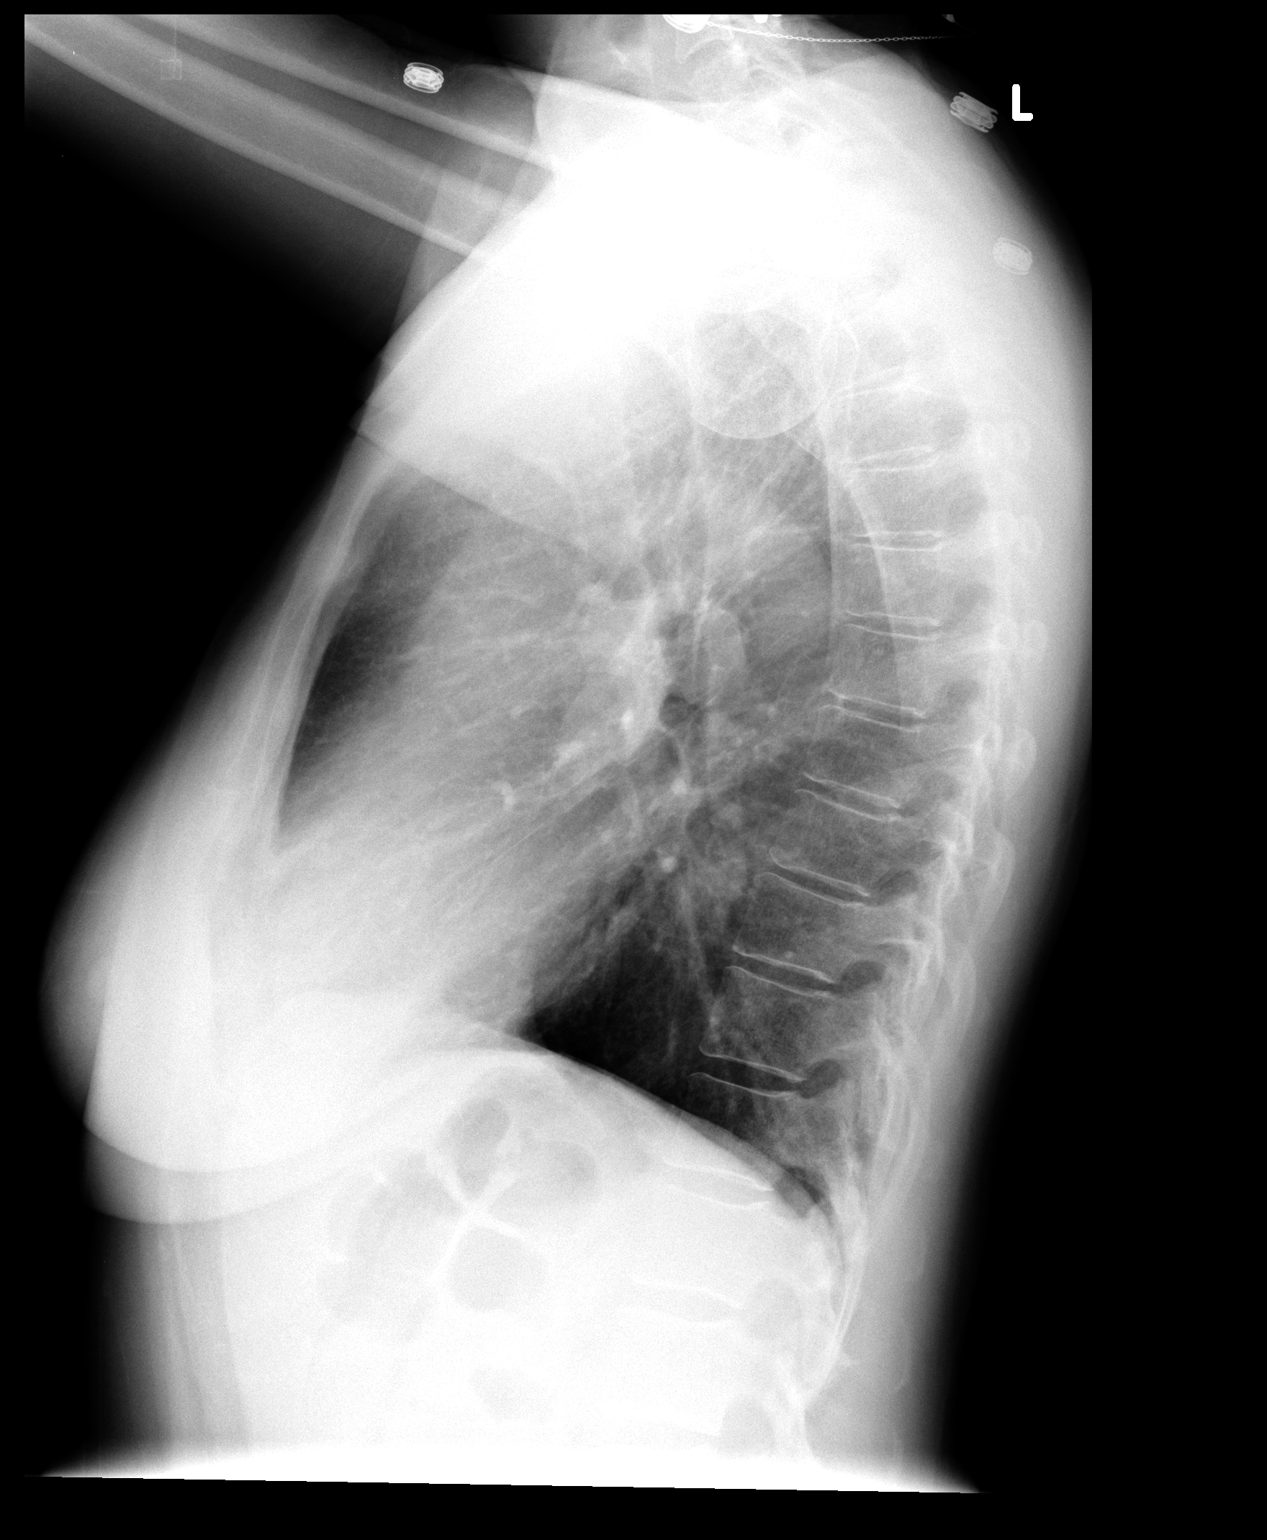

[2 of 2 positions shown; findings below may reference images not displayed]

FINDINGS: Mild pulmonary hyperexpansion and central bronchitic
changes.  No focal airspace consolidation, edema or suspicious
pulmonary nodule.  No pleural effusion or pneumothorax.  Cardiac
and mediastinal contours are within normal limits.  No acute
osseous abnormality.  Mild prominence of the interstitial markings.
IMPRESSION: 1.  No acute cardiopulmonary disease.
2.  No conventional radiographic findings to explain the patient's
hemoptysis.
3.  Mild prominence of the interstitial markings and central
bronchitic changes may reflect chronic smoking related changes and
/ or COPD.

## 2014-12-08 ENCOUNTER — Encounter: Payer: Self-pay | Admitting: Internal Medicine

## 2015-05-27 ENCOUNTER — Other Ambulatory Visit (HOSPITAL_COMMUNITY): Payer: Self-pay | Admitting: *Deleted

## 2015-05-27 DIAGNOSIS — Z1231 Encounter for screening mammogram for malignant neoplasm of breast: Secondary | ICD-10-CM

## 2015-06-02 ENCOUNTER — Ambulatory Visit (HOSPITAL_COMMUNITY)
Admission: RE | Admit: 2015-06-02 | Discharge: 2015-06-02 | Disposition: A | Discharge status: Home / Self Care | Payer: Medicaid Other | Source: Ambulatory Visit | Attending: *Deleted | Admitting: *Deleted

## 2015-06-02 DIAGNOSIS — Z1231 Encounter for screening mammogram for malignant neoplasm of breast: Secondary | ICD-10-CM | POA: Diagnosis present

## 2015-06-07 ENCOUNTER — Other Ambulatory Visit: Payer: Self-pay | Admitting: *Deleted

## 2015-06-07 DIAGNOSIS — R928 Other abnormal and inconclusive findings on diagnostic imaging of breast: Secondary | ICD-10-CM

## 2015-06-08 ENCOUNTER — Other Ambulatory Visit (HOSPITAL_COMMUNITY): Payer: Self-pay | Admitting: *Deleted

## 2015-06-08 DIAGNOSIS — R928 Other abnormal and inconclusive findings on diagnostic imaging of breast: Secondary | ICD-10-CM

## 2015-06-09 ENCOUNTER — Other Ambulatory Visit (HOSPITAL_COMMUNITY): Payer: Self-pay | Admitting: *Deleted

## 2015-06-09 DIAGNOSIS — R928 Other abnormal and inconclusive findings on diagnostic imaging of breast: Secondary | ICD-10-CM

## 2015-06-22 ENCOUNTER — Ambulatory Visit (HOSPITAL_COMMUNITY)
Admission: RE | Admit: 2015-06-22 | Discharge: 2015-06-22 | Disposition: A | Discharge status: Home / Self Care | Payer: Medicaid Other | Source: Ambulatory Visit | Attending: *Deleted | Admitting: *Deleted

## 2015-06-22 ENCOUNTER — Other Ambulatory Visit: Payer: Self-pay | Admitting: *Deleted

## 2015-06-22 DIAGNOSIS — R921 Mammographic calcification found on diagnostic imaging of breast: Secondary | ICD-10-CM

## 2015-06-22 DIAGNOSIS — R928 Other abnormal and inconclusive findings on diagnostic imaging of breast: Secondary | ICD-10-CM

## 2015-06-28 ENCOUNTER — Ambulatory Visit
Admission: RE | Admit: 2015-06-28 | Discharge: 2015-06-28 | Disposition: A | Discharge status: Home / Self Care | Payer: Medicaid Other | Source: Ambulatory Visit | Attending: *Deleted | Admitting: *Deleted

## 2015-06-28 DIAGNOSIS — R921 Mammographic calcification found on diagnostic imaging of breast: Secondary | ICD-10-CM

## 2017-06-13 ENCOUNTER — Ambulatory Visit (HOSPITAL_COMMUNITY)
Admission: EM | Admit: 2017-06-13 | Discharge: 2017-06-13 | Disposition: A | Discharge status: Home / Self Care | Payer: Self-pay | Attending: Family Medicine | Admitting: Family Medicine

## 2017-06-13 ENCOUNTER — Encounter (HOSPITAL_COMMUNITY): Payer: Self-pay | Admitting: *Deleted

## 2017-06-13 ENCOUNTER — Ambulatory Visit (INDEPENDENT_AMBULATORY_CARE_PROVIDER_SITE_OTHER): Payer: Self-pay

## 2017-06-13 DIAGNOSIS — J40 Bronchitis, not specified as acute or chronic: Secondary | ICD-10-CM

## 2017-06-13 MED ORDER — ALBUTEROL SULFATE HFA 108 (90 BASE) MCG/ACT IN AERS: 2.0000 | INHALATION_SPRAY | RESPIRATORY_TRACT | 0 refills | Status: DC | PRN

## 2017-06-13 MED ORDER — PREDNISONE 20 MG PO TABS: 40.0000 mg | ORAL_TABLET | Freq: Every day | ORAL | 0 refills | Status: DC

## 2017-06-13 MED ORDER — AZITHROMYCIN 250 MG PO TABS: ORAL_TABLET | ORAL | 0 refills | Status: DC

## 2017-06-13 MED ORDER — PREDNISONE 20 MG PO TABS: ORAL_TABLET | ORAL | 0 refills | Status: DC

## 2017-06-13 NOTE — Discharge Instructions (Signed)
Xray showed signs of Bronchitis.   We will treat you with azithromycin- 2 tablets today, then 1 tablet the following 4 days.   Please take 40 mg prednisone daily for 5 days.   Please use inhlaer as needed.   Please take daily allergy pill- Zyrtec/Claritin/Store brand. Pair with mucinex/sudafed or flonase nasal spray for congestion.   For cough use inhlaer as needed and try delsym over the counter.

## 2017-06-13 NOTE — ED Triage Notes (Signed)
Patient reports productive cough with fever last night. Was exposed to flu and pneumonia by family members.

## 2017-06-14 NOTE — ED Provider Notes (Signed)
[facility]    CSN: [id number] Arrival date & time: 06/13/17  1053     History   Chief Complaint Chief Complaint  Patient presents with  . Cough    HPI Bethany Mills is a 57 y.o. female story of hypertension presenting today with a cough, subjective fevers headache and loss of appetite.  Symptoms have been going on since Sunday-4 days.  She is concerned because her grandkids have had flu and pneumonia.  She also endorses congestion and rhinorrhea.  She is felt like her cough worsened last night and became more productive.  She does endorse smoking history of approximately 1/2 pack a day.  She has been trying Mucinex with minimal relief.  She denies vomiting, diarrhea, abdominal pain.  She denies shortness of breath and chest pain.  HPI  Past Medical History:  Diagnosis Date  . Anxiety   . Hypertension     Patient Active Problem List   Diagnosis Date Noted  . POST-TRAUMATIC HEADACHE UNSPECIFIED 03/08/2010  . HEMATEMESIS 03/08/2010  . BACK PAIN, LUMBAR 03/08/2010  . ABDOMINAL PAIN, EPIGASTRIC 03/08/2010  . CERVICAL STRAIN 03/08/2010  . RECTAL BLEEDING 12/20/2009  . WEAKNESS 12/20/2009  . PELVIC  PAIN 12/20/2009  . 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 ABDOMINAL HYSTERECTOMY      OB History    No data available       Home Medications    Prior to Admission medications   Medication Sig Start Date End Date Taking? Authorizing Provider  acetaminophen (TYLENOL) 500 MG tablet Take 2,000 mg by mouth every 6 (six) hours as needed. Pain     [provider]  albuterol (PROVENTIL HFA;VENTOLIN HFA) 108 (90 Base) MCG/ACT inhaler Inhale 2 puffs into the lungs every 4 (four) hours as needed for wheezing or shortness of breath. 06/13/17   Nishi Neiswonger C, PA-C  azithromycin (ZITHROMAX Z-PAK) 250 MG tablet Take 2 tablets today and 1 tablet for the following 4 days 06/13/17   Sabella Traore C,  PA-C  diazepam (VALIUM) 10 MG tablet Take 10 mg by mouth every 6 (six) hours as needed for anxiety.    [provider]  hydrocortisone cream 1 % Apply to affected area 4 times daily 04/07/13   Reuben LikesKeller, David C, MD  ibuprofen (ADVIL,MOTRIN) 200 MG tablet Take 400 mg by mouth every 6 (six) hours as needed for pain.    [provider]  ipratropium (ATROVENT) 0.06 % nasal spray Place 2 sprays into both nostrils 4 (four) times daily. 09/07/14   Hayden RasmussenMabe, David, NP  mirtazapine (REMERON) 30 MG tablet Take 30 mg by mouth at bedtime.    [provider]  potassium chloride SA (K-DUR,KLOR-CON) 20 MEQ tablet Take 1 tablet (20 mEq total) by mouth 2 (two) times daily. 10/25/12   Devoria AlbeKnapp, Iva, MD  predniSONE (DELTASONE) 20 MG tablet Take 40 mg daily for 5 days 06/13/17   Lew DawesWieters, Nicco Reaume C, PA-C    Family History History reviewed. No pertinent family history.  Social History Social History   Tobacco Use  . Smoking status: Current Every Day Smoker    Types: Cigarettes  . Smokeless tobacco: Never Used  Substance Use Topics  . Alcohol use: No  . Drug use: No     Allergies   Codeine; Neomycin-polymyxin b gu; and Neomycin-bacitracin zn-polymyx   Review of Systems Review of Systems  Constitutional: Positive for appetite change and fever. Negative for  chills and fatigue.  HENT: Positive for congestion, rhinorrhea and sore throat. Negative for ear pain, sinus pressure and trouble swallowing.   Respiratory: Positive for cough. Negative for chest tightness and shortness of breath.   Cardiovascular: Negative for chest pain.  Gastrointestinal: Negative for abdominal pain, nausea and vomiting.  Musculoskeletal: Negative for myalgias.  Skin: Negative for rash.  Neurological: Positive for headaches. Negative for dizziness and light-headedness.     Physical Exam Triage Vital Signs ED Triage Vitals [06/13/17 1219]  Enc Vitals Group     BP (!) 141/87     Pulse Rate 74     Resp 18      Temp 98.5 F (36.9 C)     Temp Source Oral     SpO2 99 %     Weight      Height      Head Circumference      Peak Flow      Pain Score      Pain Loc      Pain Edu?      Excl. in GC?    No data found.  Updated Vital Signs BP (!) 141/87 (BP Location: Left Arm)   Pulse 74   Temp 98.5 F (36.9 C) (Oral)   Resp 18   SpO2 99%    Physical Exam  Constitutional: She is oriented to person, place, and time. She appears well-developed and well-nourished. No distress.  HENT:  Head: Normocephalic and atraumatic.  Right Ear: Tympanic membrane and ear canal normal.  Left Ear: Tympanic membrane and ear canal normal.  Nose: Rhinorrhea present.  Mouth/Throat: Uvula is midline and mucous membranes are normal. No oral lesions. No trismus in the jaw. No uvula swelling. Posterior oropharyngeal erythema present. Tonsils are 1+ on the right. Tonsils are 1+ on the left. No tonsillar exudate.   erythematous turbinates  Eyes: Conjunctivae are normal.  Neck: Neck supple.  Cardiovascular: Normal rate and regular rhythm.  No murmur heard. Pulmonary/Chest: Effort normal and breath sounds normal. No respiratory distress. She has no wheezes. She has no rales.  No adventitious sounds appreciated  Abdominal: Soft. There is no tenderness.  Musculoskeletal: She exhibits no edema.  Neurological: She is alert and oriented to person, place, and time.  Skin: Skin is warm and dry.  Psychiatric: She has a normal mood and affect.  Nursing note and vitals reviewed.    UC Treatments / Results  Labs (all labs ordered are listed, but only abnormal results are displayed) Labs Reviewed - No data to display  EKG  EKG Interpretation None       Radiology Dg Chest 2 View  Result Date: 06/13/2017 CLINICAL DATA:  Sick for 4 days, cough, dark green thick sputum, low-grade fever, body aches, smoker EXAM: CHEST  2 VIEW COMPARISON:  10/25/2012 FINDINGS: Normal heart size, mediastinal contours, and pulmonary  vascularity. Atherosclerotic calcification aortic arch. Mild chronic bronchitic changes. Lungs otherwise clear. No pleural effusion or pneumothorax. Bones unremarkable. IMPRESSION: Chronic bronchitic changes without infiltrate. Electronically Signed   By: Ulyses Southward M.D.   On: 06/13/2017 13:16    Procedures Procedures (including critical care time)  Medications Ordered in UC Medications - No data to display   Initial Impression / Assessment and Plan / UC Course  I have reviewed the triage vital signs and the nursing notes.  Pertinent labs & imaging results that were available during my care of the patient were reviewed by me and considered in my medical decision making (see  chart for details).     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  No infiltrate noted.  Will treat for bronchitis with albuterol inhaler, prednisone 40 mg for 5 days, and azithromycin given the increased sputum production.  Recommended other over-the-counter measures to control her congestion and cough. Discussed strict return precautions. Patient verbalized understanding and is agreeable with plan.   Final Clinical Impressions(s) / UC Diagnoses   Final diagnoses:  Bronchitis    ED Discharge Orders        Ordered    albuterol (PROVENTIL HFA;VENTOLIN HFA) 108 (90 Base) MCG/ACT inhaler  Every 4 hours PRN     06/13/17 1323    predniSONE (DELTASONE) 20 MG tablet  Daily with breakfast,   Status:  Discontinued     06/13/17 1323    azithromycin (ZITHROMAX Z-PAK) 250 MG tablet     06/13/17 1323    predniSONE (DELTASONE) 20 MG tablet  Daily with breakfast,   Status:  Discontinued     06/13/17 1325    predniSONE (DELTASONE) 20 MG tablet     06/13/17 1326       Controlled Substance Prescriptions [place] Controlled Substance Registry consulted? Not Applicable   Lew Dawes, [place] 06/14/17 [phone]

## 2017-10-25 ENCOUNTER — Ambulatory Visit (HOSPITAL_COMMUNITY)
Admission: EM | Admit: 2017-10-25 | Discharge: 2017-10-25 | Disposition: A | Discharge status: Home / Self Care | Payer: Self-pay | Attending: Family Medicine | Admitting: Family Medicine

## 2017-10-25 ENCOUNTER — Encounter (HOSPITAL_COMMUNITY): Payer: Self-pay | Admitting: Family Medicine

## 2017-10-25 DIAGNOSIS — L249 Irritant contact dermatitis, unspecified cause: Secondary | ICD-10-CM

## 2017-10-25 MED ORDER — MUPIROCIN 2 % EX OINT: 1.0000 "application " | TOPICAL_OINTMENT | Freq: Two times a day (BID) | CUTANEOUS | 0 refills | Status: DC

## 2017-10-25 MED ORDER — PREDNISONE 20 MG PO TABS: 40.0000 mg | ORAL_TABLET | Freq: Every day | ORAL | 0 refills | Status: AC

## 2017-10-25 NOTE — Discharge Instructions (Signed)
Start prednisone as directed. Continue benadryl. Bactroban ointment for the wound. Avoid soap for now as your skin is irritated. Refrain from itching, ice compress may help. Monitor for spreading redness, increased warmth, fever, follow up for reevaluation needed.

## 2017-10-25 NOTE — ED Provider Notes (Signed)
[facility]    CSN: [id number] Arrival date & time: 10/25/17  1602     History   Chief Complaint Chief Complaint  Patient presents with  . Rash  . Pruritis    HPI Bethany Mills is a 57 y.o. female.   57 year old female comes in for 2 week history of rash that worsened the past couple of days. Patient sates had a spider bite 2 weeks ago under the right breast. She states despite benadryl, she scratched the area during sleep, causing abrasion and skin irritation around the area. She applied gold bond over area, stating it burned the abrasion and then developed hives under the breast. She has been taking benadryl Q4-6H with good relief until she ran out about 1 week ago. States she restarted benadryl, but has worsening rash with now whole body pruritis and woke up this morning with some eyelid swelling. Denies trouble breathing, trouble swallowing, swelling of the throat, tripoding, drooling. Denies fever, chills, night sweats.      Past Medical History:  Diagnosis Date  . Anxiety   . Hypertension     Patient Active Problem List   Diagnosis Date Noted  . POST-TRAUMATIC HEADACHE UNSPECIFIED 03/08/2010  . HEMATEMESIS 03/08/2010  . BACK PAIN, LUMBAR 03/08/2010  . ABDOMINAL PAIN, EPIGASTRIC 03/08/2010  . CERVICAL STRAIN 03/08/2010  . RECTAL BLEEDING 12/20/2009  . WEAKNESS 12/20/2009  . PELVIC  PAIN 12/20/2009  . 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 ABDOMINAL HYSTERECTOMY      OB History   None      Home Medications    Prior to Admission medications   Medication Sig Start Date End Date Taking? Authorizing Provider  acetaminophen (TYLENOL) 500 MG tablet Take 2,000 mg by mouth every 6 (six) hours as needed. Pain     [provider]  albuterol (PROVENTIL HFA;VENTOLIN HFA) 108 (90 Base) MCG/ACT inhaler Inhale 2 puffs into the lungs every 4 (four) hours as needed for wheezing or  shortness of breath. 06/13/17   Wieters, Hallie C, PA-C  diazepam (VALIUM) 10 MG tablet Take 10 mg by mouth every 6 (six) hours as needed for anxiety.    [provider]  hydrocortisone cream 1 % Apply to affected area 4 times daily 04/07/13   Reuben Likes, MD  ibuprofen (ADVIL,MOTRIN) 200 MG tablet Take 400 mg by mouth every 6 (six) hours as needed for pain.    [provider]  ipratropium (ATROVENT) 0.06 % nasal spray Place 2 sprays into both nostrils 4 (four) times daily. 09/07/14   Hayden Rasmussen, NP  mirtazapine (REMERON) 30 MG tablet Take 30 mg by mouth at bedtime.    [provider]  mupirocin ointment (BACTROBAN) 2 % Apply 1 application topically 2 (two) times daily. 10/25/17   Cathie Hoops, Damere Brandenburg V, PA-C  potassium chloride SA (K-DUR,KLOR-CON) 20 MEQ tablet Take 1 tablet (20 mEq total) by mouth 2 (two) times daily. 10/25/12   Devoria Albe, MD  predniSONE (DELTASONE) 20 MG tablet Take 2 tablets (40 mg total) by mouth daily for 5 days. 10/25/17 10/30/17  Belinda Fisher, PA-C    Family History History reviewed. No pertinent family history.  Social History Social History   Tobacco Use  . Smoking status: Current Every Day Smoker    Types: Cigarettes  . Smokeless tobacco: Never Used  Substance Use Topics  . Alcohol use: No  . Drug use: No  Allergies   Codeine; Neomycin-polymyxin b gu; and Neomycin-bacitracin zn-polymyx   Review of Systems Review of Systems  Reason unable to perform ROS: See HPI as above.     Physical Exam Triage Vital Signs ED Triage Vitals [10/25/17 1615]  Enc Vitals Group     BP 136/79     Pulse Rate 78     Resp 18     Temp 98.2 F (36.8 C)     Temp src      SpO2 99 %     Weight      Height      Head Circumference      Peak Flow      Pain Score 0     Pain Loc      Pain Edu?      Excl. in GC?    No data found.  Updated Vital Signs BP 136/79   Pulse 78   Temp 98.2 F (36.8 C)   Resp 18   SpO2 99%   Physical Exam    Constitutional: She is oriented to person, place, and time. She appears well-developed and well-nourished. No distress.  HENT:  Head: Normocephalic and atraumatic.  Eyes: Pupils are equal, round, and reactive to light. Conjunctivae and EOM are normal.  Mild swelling to the right upper and lower eyelid without erythema, increased warmth, pain.   Neurological: She is alert and oriented to person, place, and time.  Skin:  See picture below. No increased warmth. No fluctuance felt. Maculopapular rash under both breasts.        UC Treatments / Results  Labs (all labs ordered are listed, but only abnormal results are displayed) Labs Reviewed - No data to display  EKG None  Radiology No results found.  Procedures Procedures (including critical care time)  Medications Ordered in UC Medications - No data to display  Initial Impression / Assessment and Plan / UC Course  I have reviewed the triage vital signs and the nursing notes.  Pertinent labs & imaging results that were available during my care of the patient were reviewed by me and considered in my medical decision making (see chart for details).    Start prednisone as directed for contact dermatitis. Continue antihistamine. bactroban as directed. Wound care instructions given. Return precautions given. Patient expresses understanding and agrees to plan.   Final Clinical Impressions(s) / UC Diagnoses   Final diagnoses:  Irritant contact dermatitis, unspecified trigger    ED Prescriptions    Medication Sig Dispense Auth. Provider   mupirocin ointment (BACTROBAN) 2 % Apply 1 application topically 2 (two) times daily. 22 g Ormand Senn V, PA-C   predniSONE (DELTASONE) 20 MG tablet Take 2 tablets (40 mg total) by mouth daily for 5 days. 10 tablet Threasa AlphaYu, Oumar Marcott V, PA-C        Lorrie Strauch V, PA-C 10/25/17 1712

## 2017-10-25 NOTE — ED Triage Notes (Signed)
Pt here for rash and itching x 2 weeks. reportst that this al started after she was bit by a spider under the right breast. She woke up this am with swelling around her eye. She has been using benadryl orally and cream. She has also been using gold bond.

## 2022-09-13 ENCOUNTER — Emergency Department (HOSPITAL_COMMUNITY): Payer: Medicaid Other

## 2022-09-13 ENCOUNTER — Other Ambulatory Visit: Payer: Self-pay

## 2022-09-13 ENCOUNTER — Ambulatory Visit
Admission: EM | Admit: 2022-09-13 | Discharge: 2022-09-13 | Disposition: A | Discharge status: Home / Self Care | Payer: Self-pay | Attending: Family Medicine | Admitting: Family Medicine

## 2022-09-13 ENCOUNTER — Emergency Department (HOSPITAL_COMMUNITY)
Admission: EM | Admit: 2022-09-13 | Discharge: 2022-09-13 | Disposition: A | Discharge status: Home / Self Care | Payer: Medicaid Other | Attending: Emergency Medicine | Admitting: Emergency Medicine

## 2022-09-13 ENCOUNTER — Encounter: Payer: Self-pay | Admitting: Emergency Medicine

## 2022-09-13 ENCOUNTER — Encounter (HOSPITAL_COMMUNITY): Payer: Self-pay | Admitting: Emergency Medicine

## 2022-09-13 DIAGNOSIS — Z720 Tobacco use: Secondary | ICD-10-CM

## 2022-09-13 DIAGNOSIS — Z79899 Other long term (current) drug therapy: Secondary | ICD-10-CM | POA: Insufficient documentation

## 2022-09-13 DIAGNOSIS — I10 Essential (primary) hypertension: Secondary | ICD-10-CM | POA: Insufficient documentation

## 2022-09-13 DIAGNOSIS — R739 Hyperglycemia, unspecified: Secondary | ICD-10-CM | POA: Diagnosis not present

## 2022-09-13 DIAGNOSIS — Z7982 Long term (current) use of aspirin: Secondary | ICD-10-CM | POA: Insufficient documentation

## 2022-09-13 DIAGNOSIS — R4701 Aphasia: Secondary | ICD-10-CM | POA: Diagnosis present

## 2022-09-13 DIAGNOSIS — F17200 Nicotine dependence, unspecified, uncomplicated: Secondary | ICD-10-CM | POA: Diagnosis not present

## 2022-09-13 DIAGNOSIS — R030 Elevated blood-pressure reading, without diagnosis of hypertension: Secondary | ICD-10-CM

## 2022-09-13 DIAGNOSIS — R4789 Other speech disturbances: Secondary | ICD-10-CM

## 2022-09-13 LAB — URINALYSIS, ROUTINE W REFLEX MICROSCOPIC
Bilirubin Urine: NEGATIVE
Glucose, UA: 50 mg/dL — AB
Ketones, ur: NEGATIVE mg/dL
Leukocytes,Ua: NEGATIVE
Nitrite: NEGATIVE
Protein, ur: NEGATIVE mg/dL
Specific Gravity, Urine: 1.016 (ref 1.005–1.030)
pH: 5 (ref 5.0–8.0)

## 2022-09-13 LAB — CBC
HCT: 43.8 % (ref 36.0–46.0)
Hemoglobin: 14.7 g/dL (ref 12.0–15.0)
MCH: 30.1 pg (ref 26.0–34.0)
MCHC: 33.6 g/dL (ref 30.0–36.0)
MCV: 89.8 fL (ref 80.0–100.0)
Platelets: 312 10*3/uL (ref 150–400)
RBC: 4.88 MIL/uL (ref 3.87–5.11)
RDW: 13 % (ref 11.5–15.5)
WBC: 6.2 10*3/uL (ref 4.0–10.5)
nRBC: 0 % (ref 0.0–0.2)

## 2022-09-13 LAB — RAPID URINE DRUG SCREEN, HOSP PERFORMED
Amphetamines: NOT DETECTED
Barbiturates: NOT DETECTED
Benzodiazepines: NOT DETECTED
Cocaine: NOT DETECTED
Opiates: NOT DETECTED
Tetrahydrocannabinol: POSITIVE — AB

## 2022-09-13 LAB — PROTIME-INR
INR: 0.9 (ref 0.8–1.2)
Prothrombin Time: 12.6 seconds (ref 11.4–15.2)

## 2022-09-13 LAB — DIFFERENTIAL
Abs Immature Granulocytes: 0.01 10*3/uL (ref 0.00–0.07)
Basophils Absolute: 0.1 10*3/uL (ref 0.0–0.1)
Basophils Relative: 1 %
Eosinophils Absolute: 0.1 10*3/uL (ref 0.0–0.5)
Eosinophils Relative: 2 %
Immature Granulocytes: 0 %
Lymphocytes Relative: 29 %
Lymphs Abs: 1.8 10*3/uL (ref 0.7–4.0)
Monocytes Absolute: 0.8 10*3/uL (ref 0.1–1.0)
Monocytes Relative: 12 %
Neutro Abs: 3.5 10*3/uL (ref 1.7–7.7)
Neutrophils Relative %: 56 %

## 2022-09-13 LAB — COMPREHENSIVE METABOLIC PANEL
ALT: 69 U/L — ABNORMAL HIGH (ref 0–44)
AST: 46 U/L — ABNORMAL HIGH (ref 15–41)
Albumin: 4.2 g/dL (ref 3.5–5.0)
Alkaline Phosphatase: 92 U/L (ref 38–126)
Anion gap: 12 (ref 5–15)
BUN: 17 mg/dL (ref 8–23)
CO2: 21 mmol/L — ABNORMAL LOW (ref 22–32)
Calcium: 9.3 mg/dL (ref 8.9–10.3)
Chloride: 101 mmol/L (ref 98–111)
Creatinine, Ser: 0.58 mg/dL (ref 0.44–1.00)
GFR, Estimated: >60 mL/min (ref >60)
Glucose, Bld: 186 mg/dL — ABNORMAL HIGH (ref 70–99)
Potassium: 3.3 mmol/L — ABNORMAL LOW (ref 3.5–5.1)
Sodium: 134 mmol/L — ABNORMAL LOW (ref 135–145)
Total Bilirubin: 0.6 mg/dL (ref 0.3–1.2)
Total Protein: 8 g/dL (ref 6.5–8.1)

## 2022-09-13 LAB — CBG MONITORING, ED: Glucose-Capillary: 185 mg/dL — ABNORMAL HIGH (ref 70–99)

## 2022-09-13 LAB — ETHANOL: Alcohol, Ethyl (B): <10 mg/dL (ref <10)

## 2022-09-13 MED ORDER — AMLODIPINE BESYLATE 5 MG PO TABS
5.0000 mg | ORAL_TABLET | Freq: Once | ORAL | Status: AC
  Administered 2022-09-13: 5 mg via ORAL
  Filled 2022-09-13: qty 1

## 2022-09-13 MED ORDER — SODIUM CHLORIDE 0.9 % IV SOLN
100.0000 mL/h | INTRAVENOUS | Status: DC
  Administered 2022-09-13: 100 mL/h via INTRAVENOUS

## 2022-09-13 MED ORDER — AMLODIPINE BESYLATE 5 MG PO TABS: 5.0000 mg | ORAL_TABLET | Freq: Every day | ORAL | 0 refills | Status: DC

## 2022-09-13 MED ORDER — ASPIRIN 81 MG PO CHEW: 81.0000 mg | CHEWABLE_TABLET | Freq: Every day | ORAL | 0 refills | Status: AC

## 2022-09-13 MED ORDER — SODIUM CHLORIDE 0.9 % IV BOLUS
500.0000 mL | Freq: Once | INTRAVENOUS | Status: AC
  Administered 2022-09-13: 500 mL via INTRAVENOUS

## 2022-09-13 NOTE — ED Notes (Signed)
[facility] here to transport patient 

## 2022-09-13 NOTE — ED Notes (Signed)
Patient is being discharged from the Urgent Care and sent to the Emergency Department via [facility] . Per MD, patient is in need of higher level of care due to possible stroke. Patient is aware and verbalizes understanding of plan of care.  Vitals:   09/13/22 1442 09/13/22 1505  BP: (!) 171/83 (!) 200/94  Pulse: 80 100  Resp: 18   Temp: 97.6 F (36.4 C)   SpO2: 94% 95%

## 2022-09-13 NOTE — ED Provider Notes (Signed)
[place] EMERGENCY DEPARTMENT AT [facility] Provider Note   CSN: [id number] Arrival date & time: 09/13/22  1523     History  Chief Complaint  Patient presents with   Aphasia    Bethany Mills is a 62 y.o. female.  Pt is a 62 yo female with pmhx significant for htn and anxiety.  However, she has never taken meds for htn.  It has been years since she saw a pcp.  She's had a h/a for 2 days.  Today, around 1230, she felt like she could not find her words.  She denies any other neuro sx.  She drove to [facility] and EMS was called to bring her here.  Pt is moving her arms/legs normally.  She denies any vision problems.  Speech sounds clear.         Home Medications Prior to Admission medications   Medication Sig Start Date End Date Taking? Authorizing Provider  amLODipine (NORVASC) 5 MG tablet Take 1 tablet (5 mg total) by mouth daily. 09/13/22  Yes Jacalyn Lefevre, MD  aspirin 81 MG chewable tablet Chew 1 tablet (81 mg total) by mouth daily. 09/13/22  Yes Jacalyn Lefevre, MD  acetaminophen (TYLENOL) 500 MG tablet Take 2,000 mg by mouth every 6 (six) hours as needed. Pain     [provider]  busPIRone (BUSPAR) 10 MG tablet Take 20 mg by mouth once.    [provider]  ibuprofen (ADVIL,MOTRIN) 200 MG tablet Take 400 mg by mouth every 6 (six) hours as needed for pain.    [provider]      Allergies    Codeine, Neomycin-polymyxin b gu, and Neomycin-bacitracin zn-polymyx    Review of Systems   Review of Systems  Neurological:  Positive for speech difficulty.  All other systems reviewed and are negative.   Physical Exam Updated Vital Signs BP (!) 160/84   Pulse 86   Temp (!) 97.3 F (36.3 C) (Oral)   Resp 17   Ht 5\' 3"  (1.6 m)   Wt 56.2 kg   SpO2 96%   BMI 21.95 kg/m  Physical Exam Vitals and nursing note reviewed.  Constitutional:      Appearance: Normal appearance.  HENT:     Head: Normocephalic and atraumatic.     Right Ear:  External ear normal.     Left Ear: External ear normal.     Nose: Nose normal.     Mouth/Throat:     Mouth: Mucous membranes are moist.     Pharynx: Oropharynx is clear.  Eyes:     Extraocular Movements: Extraocular movements intact.     Conjunctiva/sclera: Conjunctivae normal.     Pupils: Pupils are equal, round, and reactive to light.  Cardiovascular:     Rate and Rhythm: Normal rate and regular rhythm.     Pulses: Normal pulses.     Heart sounds: Normal heart sounds.  Pulmonary:     Effort: Pulmonary effort is normal.     Breath sounds: Normal breath sounds.  Abdominal:     General: Abdomen is flat. Bowel sounds are normal.     Palpations: Abdomen is soft.  Musculoskeletal:        General: Normal range of motion.     Cervical back: Normal range of motion and neck supple.  Skin:    General: Skin is warm.     Capillary Refill: Capillary refill takes less than 2 seconds.  Neurological:     Mental Status: She  is alert and oriented to person, place, and time.     Comments: Occasional slowness in finding words, but speech is clear.  Psychiatric:        Mood and Affect: Mood normal.        Behavior: Behavior normal.     ED Results / Procedures / Treatments   Labs (all labs ordered are listed, but only abnormal results are displayed) Labs Reviewed  COMPREHENSIVE METABOLIC PANEL - Abnormal; Notable for the following components:      Result Value   Sodium 134 (*)    Potassium 3.3 (*)    CO2 21 (*)    Glucose, Bld 186 (*)    AST 46 (*)    ALT 69 (*)    All other components within normal limits  RAPID URINE DRUG SCREEN, HOSP PERFORMED - Abnormal; Notable for the following components:   Tetrahydrocannabinol POSITIVE (*)    All other components within normal limits  CBG MONITORING, ED - Abnormal; Notable for the following components:   Glucose-Capillary 185 (*)    All other components within normal limits  ETHANOL  PROTIME-INR  CBC  DIFFERENTIAL  URINALYSIS, ROUTINE W  REFLEX MICROSCOPIC  I-STAT CHEM 8, ED    EKG EKG Interpretation  Date/Time:  Wednesday Sep 13 2022 15:31:02 EDT Ventricular Rate:  86 PR Interval:  149 QRS Duration: 98 QT Interval:  396 QTC Calculation: 474 R Axis:   61 Text Interpretation: Sinus rhythm Borderline T abnormalities, inferior leads No significant change since last tracing Confirmed by Jacalyn Lefevre [phone]) on 09/13/2022 4:08:26 PM  Radiology CT HEAD WO CONTRAST  Result Date: 09/13/2022 CLINICAL DATA:  Neuro deficit, acute, stroke suspected. Aphasia. Headache and hypertension. EXAM: CT HEAD WITHOUT CONTRAST TECHNIQUE: Contiguous axial images were obtained from the base of the skull through the vertex without intravenous contrast. RADIATION DOSE REDUCTION: This exam was performed according to the departmental dose-optimization program which includes automated exposure control, adjustment of the mA and/or kV according to patient size and/or use of iterative reconstruction technique. COMPARISON:  None Available. FINDINGS: Brain: There is no evidence of an acute infarct, intracranial hemorrhage, mass, midline shift, or extra-axial fluid collection. The ventricles and sulci are within normal limits for age. Vascular: Mild calcific atherosclerosis at the skull base. No hyperdense vessel. Skull: No fracture or suspicious osseous lesion. Sinuses/Orbits: Visualized paranasal sinuses and mastoid air cells are clear. Visualized portions of the orbits are unremarkable. Other: None. IMPRESSION: No evidence of acute intracranial abnormality. Electronically Signed   By: Sebastian Ache M.D.   On: 09/13/2022 16:35   DG Chest Portable 1 View  Result Date: 09/13/2022 CLINICAL DATA:  Altered mental status EXAM: PORTABLE CHEST 1 VIEW COMPARISON:  X-ray 06/13/2017 FINDINGS: No consolidation, pneumothorax or effusion. No edema. Normal cardiopericardial silhouette. Chronic interstitial changes. Overlapping cardiac leads. IMPRESSION: Chronic changes.  No  acute cardiopulmonary disease Electronically Signed   By: Karen Kays M.D.   On: 09/13/2022 16:00    Procedures Procedures    Medications Ordered in ED Medications  sodium chloride 0.9 % bolus 500 mL (0 mLs Intravenous Stopped 09/13/22 1729)    Followed by  0.9 %  sodium chloride infusion (100 mL/hr Intravenous New Bag/Given 09/13/22 1729)  amLODipine (NORVASC) tablet 5 mg (has no administration in time range)    ED Course/ Medical Decision Making/ A&P  Medical Decision Making Amount and/or Complexity of Data Reviewed Labs: ordered. Radiology: ordered.  Risk OTC drugs. Prescription drug management.   This patient presents to the ED for concern of speech problems, this involves an extensive number of treatment options, and is a complaint that carries with it a high risk of complications and morbidity.  The differential diagnosis includes cva, tia   Co morbidities that complicate the patient evaluation  Htn, anxiety, tob abuse   Additional history obtained:  Additional history obtained from epic chart review External records from outside source obtained and reviewed including EMS report   Lab Tests:  I Ordered, and personally interpreted labs.  The pertinent results include:  cbc nl, cmp nl other than mild elevation of glucose at 186, k sl low at 3.3. uds + mj, etoh <10, inr 0.9   Imaging Studies ordered:  I ordered imaging studies including ct head, cxr  I independently visualized and interpreted imaging which showed  CT head: No evidence of acute intracranial abnormality.  CXR: Chronic changes.  No acute cardiopulmonary disease  I agree with the radiologist interpretation   Cardiac Monitoring:  The patient was maintained on a cardiac monitor.  I personally viewed and interpreted the cardiac monitored which showed an underlying rhythm of: nsr   Medicines ordered and prescription drug management:  I ordered medication including  norvasc  for htn  Reevaluation of the patient after these medicines showed that the patient improved I have reviewed the patients home medicines and have made adjustments as needed   Test Considered:  mri   Critical Interventions:  ct   Problem List / ED Course:  Mild aphasia:  code stroke not called due to no lvo and minimal sx.  Pt's aphasia has completely resolved.  I wanted her to go to [facility] for a MRI, but she refuses.  She knows that if she worsens, she needs to return or go to [facility].  She is started on asa 81 mg and told to try to quit smoking. HTN:  pt has not seen a pcp in years.  I am going to start her on amlodipine.  She is encouraged to eat a DASH diet. Hyperglycemia:  pt told to eat a diabetic diet and get this rechecked when she establishes with a pcp.   Reevaluation:  After the interventions noted above, I reevaluated the patient and found that they have :improved   Social Determinants of Health:  Lives at home.  No insurance.  No pcp.   Dispostion:  After consideration of the diagnostic results and the patients response to treatment, I feel that the patent would benefit from discharge with outpatient f/u.          Final Clinical Impression(s) / ED Diagnoses Final diagnoses:  Hypertension, unspecified type  Tobacco abuse  Hyperglycemia    Rx / DC Orders ED Discharge Orders          Ordered    amLODipine (NORVASC) 5 MG tablet  Daily        09/13/22 1801    aspirin 81 MG chewable tablet  Daily        09/13/22 1801              Jacalyn Lefevre, MD 09/13/22 1810

## 2022-09-13 NOTE — ED Provider Notes (Addendum)
  [facility] CARE CENTER   [id number] 09/13/22 Arrival Time: 1432  ASSESSMENT & PLAN:  1. Word finding difficulty   2. Elevated blood pressure reading without diagnosis of hypertension    Last known well approx 12:30 today. With word finding difficulty here; otherwise neurologically intact. No h/o CVA. Denies head trauma. To ED via EMS; stable upon discharge.  BP (!) 200/94 (BP Location: Right Arm)   Pulse 100   Temp 97.6 F (36.4 C) (Oral)   Resp 18   SpO2 95%   Outlined signs and symptoms indicating need for more acute intervention. Patient verbalized understanding. After Visit Summary given.   SUBJECTIVE: History from: Patient. Struggles with history somewhat.  Bethany Mills is a 62 y.o. female who presents with complaint of a feeling unwell and off-balance. Reports stepping from shower approx 12:30 today and feeling very weak. Denies headache. Is having difficulty finding words and admits to this during history taking. Mother with h/o CVA. She is very worried. Drove herself here. Continues to feel off-balance. Denies head injury. H/O elevated BP but this has never been treated. Denies arm/leg/facial weakness. Denies vision loss.    OBJECTIVE:  Vitals:   09/13/22 1442  BP: (!) 171/83  Pulse: 80  Resp: 18  Temp: 97.6 F (36.4 C)  TempSrc: Oral  SpO2: 94%    General appearance: alert; NAD but appears worried; tearful HENT: normocephalic; atraumatic Eyes: PERRLA; EOMI; conjunctivae normal Neck: supple with FROM Lungs: clear to auscultation bilaterally; unlabored respirations Heart: regular Extremities: no edema; symmetrical with no gross deformities Skin: warm and dry Neurologic: alert; pauses when trying to tell history but is eventually able to complete here sentences; CN 2-12 grossly intact; without facial droop; slightly unsteady gait; normal symmetric reflexes; normal extremity strength and sensation of upper and lower extremities;normal grip strength  (R-handed) Psychological: alert and cooperative; normal mood and affect   Allergies  Allergen Reactions   Codeine Nausea And Vomiting   Neomycin-Polymyxin B Gu Other (See Comments)    Causes eyes to swell, run and crust over.    Neomycin-Bacitracin Zn-Polymyx Hives and Rash    Past Medical History:  Diagnosis Date   Anxiety    Hypertension    Social History   Socioeconomic History   Marital status: Divorced    Spouse name: Not on file   Number of children: Not on file   Years of education: Not on file   Highest education level: Not on file  Occupational History   Not on file  Tobacco Use   Smoking status: Every Day    Types: Cigarettes   Smokeless tobacco: Never  Substance and Sexual Activity   Alcohol use: No   Drug use: No   Sexual activity: Not on file  Other Topics Concern   Not on file  Social History Narrative   Not on file   Social Determinants of Health   Financial Resource Strain: Not on file  Food Insecurity: Not on file  Transportation Needs: Not on file  Physical Activity: Not on file  Stress: Not on file  Social Connections: Not on file  Intimate Partner Violence: Not on file   History reviewed. No pertinent family history. Past Surgical History:  Procedure Laterality Date   ABDOMINAL HYSTERECTOMY        Mardella Layman, MD 09/13/22 1504    Mardella Layman, MD 09/13/22 1517    Mardella Layman, MD 09/13/22 1517

## 2022-09-13 NOTE — ED Triage Notes (Signed)
Urgent care sent pt over for asphasia. When ems arrived pt was speaking. Pt is speaking at this time. Pt went to urgent care for headache and hypertension.

## 2022-09-13 NOTE — ED Triage Notes (Signed)
States BP is high,  states she feels pressure in her head x 2 days.  Today is worse.   States she feels weak today.

## 2022-09-13 NOTE — ED Notes (Signed)
Patient had a difficult time answering question at times.

## 2022-09-13 NOTE — ED Notes (Signed)
Last known well was 1230 today.

## 2022-09-13 NOTE — ED Notes (Signed)
Report called to ED charge RN.

## 2022-09-13 NOTE — Discharge Instructions (Addendum)
Try to stop smoking.  It is very important that you establish care with a PCP.  Your blood pressure and your blood sugar needs to be rechecked.  If your symptoms worsen, you need to return.

## 2022-09-14 ENCOUNTER — Telehealth: Payer: Self-pay

## 2022-09-14 DIAGNOSIS — Z139 Encounter for screening, unspecified: Secondary | ICD-10-CM

## 2022-09-14 LAB — POCT GLYCOSYLATED HEMOGLOBIN (HGB A1C): Hemoglobin A1C: 6.2 % — AB (ref 4.0–5.6)

## 2022-09-14 LAB — GLUCOSE, POCT (MANUAL RESULT ENTRY): POC Glucose: 150 mg/dl — AB (ref 70–99)

## 2022-09-14 NOTE — Telephone Encounter (Signed)
Patient's son called office line and screened mom for [facility] program. Son stated he will try to come by our office today or tomorrow with the necessary documents for enrollment into the program and receive a PCP through our program

## 2022-09-14 NOTE — Congregational Nurse Program (Addendum)
Dept: [phone]   Congregational Nurse Program Note  Date of Encounter: 09/14/2022  Past Medical History: Past Medical History:  Diagnosis Date   Anxiety    Hypertension     Encounter Details:  CNP Questionnaire - 09/14/22 1200       Questionnaire   Ask client: Do you give verbal consent for me to treat you today? Yes    Student Assistance N/A    Location Patient Served  [facility]    Visit Setting with Client Organization    Patient Status Unknown    Insurance Uninsured ([facility] [employer]/[facility]/Self-Pay/Medicaid Family Planning)    Insurance/Financial Assistance Referral [facility] Financial Assistance;[place] [facility]    Medication N/A    Medical Provider No    Screening Referrals Made N/A    Medical Referrals Made Non-[facility] PCP/Clinic    Medical Appointment Made Non-[facility] PCP/clinic    Recently w/o PCP, now 1st time PCP visit completed due to CNs referral or appointment made N/A    Food N/A    Transportation N/A    Housing/Utilities N/A    Interpersonal Safety N/A    Interventions Advocate/Support;Navigate Healthcare System;Case Management;Counsel;Educate    Abnormal to Normal Screening Since Last CN Visit N/A    Screenings CN Performed Blood Pressure;Blood Glucose;A1C;Temperature;Pulse Ox    Sent Client to Lab for: N/A    Did client attend any of the following based off CNs referral or appointments made? N/A    ED Visit Averted N/A    Life-Saving Intervention Made N/A            Enrollment to [facility] into [facility] today for enrollment and referral for a PCP. She is uninsured, lives currently with her mother, she does receive survivor's benefits. Her son brought her in today.  09/13/22 after taking a shower, she felt "heavy  in her head" and was having difficulty with her speech, she drove herself to Urgent Care and was found to be hypertensive with aphasia and was transported to [facility] via ambulance. See notes in EPIC.  Past Medical  History:  Anxiety : has been treated at [facility], states she has not been in 3-4 mos Rectal bleeding with rectal tear Hysterectomy  Medications:  ASA 81 mg taking  Amlodipine 5 mg taking took one hour before visit today Buspirone ( unsure dosage) taking   Alert and oriented to person and place and time, answers appropriately however noted slowness finding words, but speech is clear. Positive for facial symmetry, no drooping noted. Smile symmetrical. Denies current headache or "heavy feeling" last felt that yesterday when she went to Urgent Care/ER. She feels her speech is no worse and somewhat improved. Her son confirms it is not worse and some better as far as answers "a little faster" Vision: she denies any vision changes/ she is wearing glasses. Upper extremities: denies any weakness of arms, 2+ bilateral grips and equal strength noted both arms. Lower extremities: her gait is steady but slowed she states she feels weak and "unsure" states this is no different than yesterday and has not worsened and son confirms.   She denies any chest pains or shortness of breath today, however she does report some shortness of breath with activity such as walking up an incline or to the mailbox.  She is a pack a day smoker, but has "made up my mind I want to quit" discussed effects of smoking on her blood pressure and risk of stroke and heart attack.  Denies any  abdominal pain and denies any problems with indigestion or acid reflux. Denies issues with urination or bowels. States she is up once per night to urinate. States she drinks a lot of water, denies weight loss, excessive thirst or hunger, denies history of Diabetes in family however blood glucose in ER 09/13/22 elevated and they provided her with Diabetes education materials  Vital signs today: 142/86 left arm, sitting normal cuff, pulse 72 , Temp 97.9 orally, Resp 14 and non labored. Pulse oximetry 97% room air. Fasting glucose by fingerstick 150 In  light of ER visit 09/13/22 for possible stroke/TIA and hypertension and abnormal fasting glucose today( although not over 250) felt prudent to check A1C for further management needs A1C today 6.2 No Covid vaccines..  Provided client with education on pre-diabetes, nutritional eating, discussed diet changes of avoiding sugary drinks, fried, fatty foods, to limit carbs such as potatoes, pasta and breads and to eat more fresh non-starchy vegetables. Discussed that her new primary care provider would determine any next steps in regards to labs, medication changes as well as further testing  Mental Health:  Reports history of anxiety and has been treated at [facility], last seen was 3 to 4 mos ago, recommended she call [facility] and make an appointment to prevent having to redo her intake.  She reports she is prescribed Buspar and has that prescription and is taking.  PHQ9 score today =8 Little pleasure or interest in doing things= 1 Feeling down, depressed or hopeless= 0 Trouble falling asleep or staying asleep or sleeping too much =1 Feeling tired or having little energy= 2 Poor appetite or overeating= 1 Feeling bad about yourself or that you've let yourself or family down= 1 Trouble concentrating on things such as reading the newspaper or watching tv= 0 Moving or speaking so slowing that others notice or opposite fidgety or restless = 2 Thoughts that you would be better off dead or of hurting yourself= 0 Total = 8  SDOH= expressed no current needs/ provided resource guide, care guide and RN's business cards.   Plan: PCP client chooses to establish care with [facility] appointment secured next availability that son can drive her. Appointment secured for 08/19/22 at 0900  Appointment, address and phone number provided. Discussed if she ever needs to cancel or reschedule her appts to call 24 hours in advance and if no answer, please leave a voicemail.   Pre-Diabetes healthy nutrition, know your  numbers and prediabetes handout given. Client was provided with hypertension handout at ER and discussed DASH diet  Discussed multiple times with client and her son BEFAST Reasons to call 911 and seek emergency treatment Difficulty with balance or worsening balance issues Eyes: sudden vision changes or sudden headaches or feeling of heaviness as she has before Facial weakness or drooping Arms, weakness of arms or loss of coordination of arms as well as increased weakness of legs  Speech: changes in current speech, worsening or new symptoms slurred speech or unable to speak. Time is important not to drive yourself call [phone]  Also call if any chest pains or shortness of breath. Client and son expressed understanding.  Medications: she has her ASA 81 mg and Amlodipine 5 mg, encouraged her to take regularly as ordered around the same time each day.  Driving: she reports she does drive, but thinks since yesterday's events that it is better for her not to drive at this time. I discussed that this is probably a wise decision until she can be  seen on 09/18/22 and discuss with her new provider and have further evaluations. She is in agreement. Son was also present and he states he or other family will help her get to appointments as needed at this time.  Applications currently in process with [facility]  [employer] awaiting one document survivor benefits Medicaid awaiting one document survivor benefits [employer]FA awaiting Medicaid window per Care Guide  Will follow up after her first visit with provider.  Francee Nodal RN [employer]

## 2022-09-15 ENCOUNTER — Telehealth: Payer: Self-pay

## 2022-09-15 NOTE — Telephone Encounter (Signed)
Called today to check in with client as she had an ER visit on 09/13/22 for hypertension, difficult speech and her "head feeling heavy" and lower extremity weakness.  09/14/22 she enrolled in [facility] Connect and a brief medical screening was performed. See EPIC notes Her speech was clear, but delayed as searching for words, but clear. She and her son were provided with instructions of needing URGENT 911 and emergent evaluation and understood.  Called today and client answered phone, her speech is clear and less delayed. She states she feels her speech is better today and she has been able to do "a few things today" she remembers the guidelines to call 911 BEFAST but feels as she is better today. She has taken her medications.  Her appointment to establish primary medical care is scheduled with [facility] on 09/18/22 at 0900 plan to follow up with client after her appointment, she is agreeable.    Bethany Nodal RN  [facility]

## 2022-09-18 ENCOUNTER — Encounter: Payer: Self-pay | Admitting: Physician Assistant

## 2022-09-18 ENCOUNTER — Ambulatory Visit: Payer: Medicaid Other | Admitting: Physician Assistant

## 2022-09-18 VITALS — BP 152/84 | HR 71 | Temp 97.2°F

## 2022-09-18 DIAGNOSIS — Z7689 Persons encountering health services in other specified circumstances: Secondary | ICD-10-CM

## 2022-09-18 DIAGNOSIS — F17200 Nicotine dependence, unspecified, uncomplicated: Secondary | ICD-10-CM

## 2022-09-18 DIAGNOSIS — I10 Essential (primary) hypertension: Secondary | ICD-10-CM

## 2022-09-18 MED ORDER — AMLODIPINE BESYLATE 10 MG PO TABS: 10.0000 mg | ORAL_TABLET | Freq: Every day | ORAL | 0 refills | Status: DC

## 2022-09-18 NOTE — Progress Notes (Signed)
   BP (!) 152/84   Pulse 71   Temp (!) 97.2 F (36.2 C)   SpO2 98%    Subjective:    Patient ID: Bethany Mills, female    DOB: [birth year], 62 y.o.   MRN: [id number]  HPI: Bethany Mills is a 62 y.o. female presenting on 09/18/2022 for New Patient (Initial Visit)   HPI  Chief Complaint  Patient presents with   New Patient (Initial Visit)    Pt is 61yoF who is in today with her son to establish care.  During initial minutes with pt it is found that patient has medicaid  Pt Taking amlodipine 5mg  since May 8 when seen in ER.   Pt says it is helping but is not enough.  She is monitoring her bp at home and it is running in the 150s.  She took her last dose of medication this morning.   Relevant past medical, surgical, family and social history reviewed and updated as indicated. Interim medical history since our last visit reviewed. Allergies and medications reviewed and updated.   Current Outpatient Medications:    amLODipine (NORVASC) 5 MG tablet, Take 1 tablet (5 mg total) by mouth daily., Disp: 30 tablet, Rfl: 0   aspirin 81 MG chewable tablet, Chew 1 tablet (81 mg total) by mouth daily., Disp: 30 tablet, Rfl: 0   busPIRone (BUSPAR) 10 MG tablet, Take 20 mg by mouth once., Disp: , Rfl:    acetaminophen (TYLENOL) 500 MG tablet, Take 2,000 mg by mouth every 6 (six) hours as needed. Pain , Disp: , Rfl:    ibuprofen (ADVIL,MOTRIN) 200 MG tablet, Take 400 mg by mouth every 6 (six) hours as needed for pain., Disp: , Rfl:     Review of Systems  Per HPI unless specifically indicated above     Objective:    BP (!) 152/84   Pulse 71   Temp (!) 97.2 F (36.2 C)   SpO2 98%   Wt Readings from Last 3 Encounters:  09/13/22 123 lb 14.4 oz (56.2 kg)  10/25/12 124 lb (56.2 kg)  01/30/11 127 lb (57.6 kg)    Physical Exam Vitals reviewed.  Constitutional:      General: She is not in acute distress.    Appearance: She is not toxic-appearing.  HENT:     Head: Normocephalic  and atraumatic.  Cardiovascular:     Rate and Rhythm: Normal rate and regular rhythm.  Pulmonary:     Effort: Pulmonary effort is normal. No respiratory distress.     Breath sounds: Normal breath sounds.  Musculoskeletal:     Right lower leg: No edema.     Left lower leg: No edema.  Neurological:     Mental Status: She is alert and oriented to person, place, and time.  Psychiatric:        Behavior: Behavior normal.            Assessment & Plan:    Encounter Diagnoses  Name Primary?   Encounter to establish care Yes   Primary hypertension    Tobacco use disorder      -records and labs from ER reviewed -discussed with pt that she will need to establish care with provider that accepts medicaid; [facility] does not accept medicaid -will increase the amlodipine -pt asks about driving and will defer to her new pcp as thorough H&P will be required

## 2022-09-21 ENCOUNTER — Telehealth: Payer: Self-pay

## 2022-09-21 NOTE — Telephone Encounter (Signed)
Called to follow up with [facility] client who is now transitioning to Medicaid. She did complete her appointment 09/18/22 at [facility] and her Amlodipine was increased to 10 mg daily and she is taking that. She is monitoring her blood pressure and was instructed to keep a log for her new provider.  She states she is feeling much better and denies symptoms she had that took her to the hospital. Today her speech is clear and is not searching for words. She reports she can tell her speech has improved. She is continuing to work on smoking cessation. We discussed that her blood pressure has improved from the day she went to the hospital and her medications may need to be adjusted by her providers. She has an appointment to establish care with [facility] on 10/03/22. Free clinic provider deffered further testing and discussion regarding resuming driving to her new provider.  Plan: Ms Toguchi is agreeable to follow up call after her new provider appointment as she transitions to [facility]. Discussed that if any symptoms return or current symptoms worsen again to call 911 right away for emergency treatment.   Francee Nodal RN [employer]

## 2022-10-03 ENCOUNTER — Encounter: Payer: Self-pay | Admitting: Family Medicine

## 2022-10-03 ENCOUNTER — Ambulatory Visit: Payer: Medicaid Other | Admitting: Family Medicine

## 2022-10-03 VITALS — BP 130/70 | HR 70 | Temp 97.7°F | Ht 63.0 in | Wt 150.0 lb

## 2022-10-03 DIAGNOSIS — Z7689 Persons encountering health services in other specified circumstances: Secondary | ICD-10-CM | POA: Insufficient documentation

## 2022-10-03 DIAGNOSIS — I10 Essential (primary) hypertension: Secondary | ICD-10-CM | POA: Diagnosis not present

## 2022-10-03 DIAGNOSIS — Z1159 Encounter for screening for other viral diseases: Secondary | ICD-10-CM

## 2022-10-03 DIAGNOSIS — Z114 Encounter for screening for human immunodeficiency virus [HIV]: Secondary | ICD-10-CM

## 2022-10-03 DIAGNOSIS — R4182 Altered mental status, unspecified: Secondary | ICD-10-CM

## 2022-10-03 DIAGNOSIS — Z1211 Encounter for screening for malignant neoplasm of colon: Secondary | ICD-10-CM

## 2022-10-03 DIAGNOSIS — Z1231 Encounter for screening mammogram for malignant neoplasm of breast: Secondary | ICD-10-CM

## 2022-10-03 NOTE — Patient Instructions (Signed)
It was great to meet you today and I'm excited to have you join the [facility] practice. I hope you had a positive experience today! If you feel so inclined, please feel free to recommend our practice to friends and family. Soua Caltagirone, FNP-C  

## 2022-10-03 NOTE — Progress Notes (Signed)
New Patient Office Visit  Subjective    Patient ID: Bethany Mills, female    DOB: 07-07-1960  Age: 62 y.o. MRN: [id number]  CC:  Chief Complaint  Patient presents with   Establish Care    HPI JAELAN TEATER presents to establish care with her son. Oriented to practice routines and expectations. PMH includes allergies, anxiety, depression, COPD, and HTN. No recent PCP. She does see a Therapist, sports. She did have a recent ED visit on 09/13/2022 where she was worried she was having a stroke with word finding difficulty, she was found to be hypertensive, CT was negative and she did not have an MRI completed, was started on Amlodipine and Aspirin. Her son reports she has not been herself since the incident and is still having word finding difficulty. He is concerned she did not have an MRI completed and would like this done. Since then she has been had an "icky feeling" in the mornings and reports she does not want to get out of bed or do anything during the day and generalized weakness. She has been monitoring her blood sugars at home and reports when she felt "icky" her BG was 191. She was drinking 6-7 beers every other day at that time and has stopped drinking ETOH. She has also reduced her sweets intake and her BG has improved. Ms Damuth denies difficulty speaking, confusion, balance difficulty, unilateral weakness, headaches, vision changes, chest pain.  Breast CA screening: Mammogram status: Completed 2017. Repeat every year Cervical CA screening: patient does not recall when last pap was Colon CA screening: due Tobacco: smoker  (1 ppd x 15 yrs) Vaccines:  needs Tdap    Outpatient Encounter Medications as of 10/03/2022  Medication Sig   acetaminophen (TYLENOL) 500 MG tablet Take 2,000 mg by mouth every 6 (six) hours as needed. Pain    amLODipine (NORVASC) 10 MG tablet Take 1 tablet (10 mg total) by mouth daily.   aspirin 81 MG chewable tablet Chew 1 tablet (81 mg total) by mouth  daily.   busPIRone (BUSPAR) 10 MG tablet Take 20 mg by mouth once.   ibuprofen (ADVIL,MOTRIN) 200 MG tablet Take 400 mg by mouth every 6 (six) hours as needed for pain. (Patient not taking: Reported on 10/03/2022)   No facility-administered encounter medications on file as of 10/03/2022.    Past Medical History:  Diagnosis Date   Allergies    Anxiety    COPD (chronic obstructive pulmonary disease) (HCC)    Hyperglycemic coma (HCC)    Hypertension     Past Surgical History:  Procedure Laterality Date   ABDOMINAL HYSTERECTOMY      Family History  Problem Relation Age of Onset   Heart disease Mother    Hypertension Mother    COPD Mother    Cancer Mother    Heart disease Father    Hypertension Father    Cancer Maternal Grandmother    Diabetes Paternal Grandmother    Heart disease Paternal Grandfather     Social History   Socioeconomic History   Marital status: Divorced    Spouse name: Not on file   Number of children: Not on file   Years of education: Not on file   Highest education level: Not on file  Occupational History   Not on file  Tobacco Use   Smoking status: Every Day    Packs/day: 1.00    Years: 15.00    Additional pack years: 0.00    Total pack  years: 15.00    Types: Cigarettes   Smokeless tobacco: Never  Substance and Sexual Activity   Alcohol use: No   Drug use: No   Sexual activity: Not Currently  Other Topics Concern   Not on file  Social History Narrative   Not on file   Social Determinants of Health   Financial Resource Strain: Not on file  Food Insecurity: Not on file  Transportation Needs: Not on file  Physical Activity: Not on file  Stress: Not on file  Social Connections: Not on file  Intimate Partner Violence: Not on file    Review of Systems  Eyes: Negative.   Cardiovascular: Negative.   Neurological: Negative.         Objective    BP 130/70   Pulse 70   Temp 97.7 F (36.5 C) (Oral)   Ht 5\' 3"  (1.6 m)   Wt 150 lb  (68 kg)   SpO2 97%   BMI 26.57 kg/m   Physical Exam Vitals and nursing note reviewed.  Constitutional:      Appearance: Normal appearance. She is normal weight.  HENT:     Head: Normocephalic and atraumatic.  Cardiovascular:     Rate and Rhythm: Normal rate and regular rhythm.     Pulses: Normal pulses.     Heart sounds: Normal heart sounds.  Pulmonary:     Effort: Pulmonary effort is normal.     Breath sounds: Normal breath sounds.  Skin:    General: Skin is warm and dry.  Neurological:     General: No focal deficit present.     Mental Status: She is alert and oriented to person, place, and time. Mental status is at baseline.     GCS: GCS eye subscore is 4. GCS verbal subscore is 5. GCS motor subscore is 6.     Cranial Nerves: Cranial nerves 2-12 are intact.     Sensory: Sensation is intact.     Motor: Motor function is intact.     Coordination: Coordination is intact.     Gait: Gait is intact.  Psychiatric:        Mood and Affect: Mood normal.        Behavior: Behavior normal.        Thought Content: Thought content normal.        Judgment: Judgment normal.         Assessment & Plan:   Problem List Items Addressed This Visit     Altered mental status - Primary    Symptoms continuing since ED evaluation. Her son is with her and reports she is not herself and continues to have trouble with word findings. No new symptoms. Physical exam in office unremarkable. Patient and her son would like to go ahead with MRI and recommended in the ED, I will order this today. Her BP is improved on 10mg  daily amlodipine, she is taking 81mg  ASA daily, will recheck labs and cholesterol this week when fasting.       Relevant Orders   MR Brain W Wo Contrast   Encounter to establish care with new doctor    Today we reviewed your PMH and current concerns. Will obtain fasting labs this week and she will return to office for complete physcial with PAP. HM items reviewed, mammogram and  cologuard ordered. Discussed the importance of limiting ETOH to no more than 1 drink a day and completely eliminating if she is unable to limit to 1 drink. Will discuss smoking  cessation at her physical as well as administer TDap.      Other Visit Diagnoses     Colon cancer screening       Relevant Orders   Cologuard   Need for hepatitis C screening test       Relevant Orders   Hepatitis C antibody   Screening for HIV (human immunodeficiency virus)       Relevant Orders   HIV Antibody (routine testing w rflx)   Encounter for screening mammogram for malignant neoplasm of breast       Relevant Orders   MM DIGITAL SCREENING BILATERAL   Primary hypertension       Relevant Orders   CBC with Differential/Platelet   COMPLETE METABOLIC PANEL WITH GFR   Lipid panel   TSH       Return for annual physical, PAP.   Park Meo, FNP

## 2022-10-03 NOTE — Assessment & Plan Note (Addendum)
Symptoms continuing since ED evaluation. Her son is with her and reports she is not herself and continues to have trouble with word findings. No new symptoms. Physical exam in office unremarkable. Patient and her son would like to go ahead with MRI and recommended in the ED, I will order this today. Her BP is improved on 10mg  daily amlodipine, she is taking 81mg  ASA daily, will recheck labs and cholesterol this week when fasting.

## 2022-10-03 NOTE — Assessment & Plan Note (Signed)
Today we reviewed your PMH and current concerns. Will obtain fasting labs this week and she will return to office for complete physcial with PAP. HM items reviewed, mammogram and cologuard ordered. Discussed the importance of limiting ETOH to no more than 1 drink a day and completely eliminating if she is unable to limit to 1 drink. Will discuss smoking cessation at her physical as well as administer TDap.

## 2022-10-04 ENCOUNTER — Other Ambulatory Visit: Payer: Medicaid Other

## 2022-10-04 LAB — CBC WITH DIFFERENTIAL/PLATELET
Absolute Monocytes: 974 cells/uL — ABNORMAL HIGH (ref 200–950)
HCT: 39.7 % (ref 35.0–45.0)
Lymphs Abs: 2688 cells/uL (ref 850–3900)
MCV: 85.9 fL (ref 80.0–100.0)
Neutro Abs: 4776 cells/uL (ref 1500–7800)
RDW: 12.5 % (ref 11.0–15.0)

## 2022-10-05 LAB — HIV ANTIBODY (ROUTINE TESTING W REFLEX): HIV 1&2 Ab, 4th Generation: NONREACTIVE

## 2022-10-05 LAB — LIPID PANEL
Cholesterol: 314 mg/dL — ABNORMAL HIGH (ref <200)
HDL: 42 mg/dL — ABNORMAL LOW (ref >=50)
LDL Cholesterol (Calc): 240 mg/dL (calc) — ABNORMAL HIGH
Non-HDL Cholesterol (Calc): 272 mg/dL (calc) — ABNORMAL HIGH (ref <130)
Total CHOL/HDL Ratio: 7.5 (calc) — ABNORMAL HIGH (ref <5.0)
Triglycerides: 158 mg/dL — ABNORMAL HIGH (ref <150)

## 2022-10-05 LAB — COMPLETE METABOLIC PANEL WITH GFR
AG Ratio: 1.4 (calc) (ref 1.0–2.5)
ALT: 37 U/L — ABNORMAL HIGH (ref 6–29)
AST: 23 U/L (ref 10–35)
Albumin: 4.3 g/dL (ref 3.6–5.1)
Alkaline phosphatase (APISO): 107 U/L (ref 37–153)
BUN: 12 mg/dL (ref 7–25)
CO2: 28 mmol/L (ref 20–32)
Calcium: 9.6 mg/dL (ref 8.6–10.4)
Chloride: 102 mmol/L (ref 98–110)
Creat: 0.7 mg/dL (ref 0.50–1.05)
Globulin: 3.1 g/dL (calc) (ref 1.9–3.7)
Glucose, Bld: 119 mg/dL — ABNORMAL HIGH (ref 65–99)
Potassium: 4.2 mmol/L (ref 3.5–5.3)
Sodium: 141 mmol/L (ref 135–146)
Total Bilirubin: 0.4 mg/dL (ref 0.2–1.2)
Total Protein: 7.4 g/dL (ref 6.1–8.1)
eGFR: 97 mL/min/{1.73_m2} (ref >=60)

## 2022-10-05 LAB — CBC WITH DIFFERENTIAL/PLATELET
Basophils Absolute: 44 cells/uL (ref 0–200)
Basophils Relative: 0.5 %
Eosinophils Absolute: 218 cells/uL (ref 15–500)
Eosinophils Relative: 2.5 %
Hemoglobin: 13.7 g/dL (ref 11.7–15.5)
MCH: 29.7 pg (ref 27.0–33.0)
MCHC: 34.5 g/dL (ref 32.0–36.0)
MPV: 9.3 fL (ref 7.5–12.5)
Monocytes Relative: 11.2 %
Neutrophils Relative %: 54.9 %
Platelets: 421 10*3/uL — ABNORMAL HIGH (ref 140–400)
RBC: 4.62 10*6/uL (ref 3.80–5.10)
Total Lymphocyte: 30.9 %
WBC: 8.7 10*3/uL (ref 3.8–10.8)

## 2022-10-05 LAB — TSH: TSH: 4.08 mIU/L (ref 0.40–4.50)

## 2022-10-05 LAB — HEPATITIS C ANTIBODY: Hepatitis C Ab: NONREACTIVE

## 2022-10-06 ENCOUNTER — Other Ambulatory Visit: Payer: Self-pay

## 2022-10-06 DIAGNOSIS — I10 Essential (primary) hypertension: Secondary | ICD-10-CM

## 2022-10-06 MED ORDER — ROSUVASTATIN CALCIUM 20 MG PO TABS: 20.0000 mg | ORAL_TABLET | Freq: Every day | ORAL | 3 refills | Status: DC

## 2022-10-09 ENCOUNTER — Telehealth: Payer: Self-pay

## 2022-10-09 NOTE — Telephone Encounter (Signed)
Called to follow up with [facility] client who has now transitioned to [facility] and has had her first appointment last week at [facility]. She states the appointment went well, she states they added a cholesterol medication and are doing more tests and has an MRI scheduled in July. She reports taking her medications as prescribed. She states things are going "okay" with her speech and balance. She states she was released to drive again and that has improved her mood. She states she is getting back to her normal routine.   She states she has an appointment June 5th for a "physical " She denies any needs at this time.  Plan: as she has now established with her new primary care provider and transitioned to Medicaid. From being uninsured, will plan follow up in July after MRI just for a check in. She and her son have my number and may call if we may be of any further assistance. She is agreeable.   Francee Nodal RN [employer]

## 2022-10-11 ENCOUNTER — Encounter: Payer: Self-pay | Admitting: Family Medicine

## 2022-10-11 ENCOUNTER — Ambulatory Visit (INDEPENDENT_AMBULATORY_CARE_PROVIDER_SITE_OTHER): Payer: Medicaid Other | Admitting: Family Medicine

## 2022-10-11 VITALS — BP 122/72 | HR 76 | Temp 97.6°F | Ht 63.0 in | Wt 147.8 lb

## 2022-10-11 DIAGNOSIS — F17200 Nicotine dependence, unspecified, uncomplicated: Secondary | ICD-10-CM

## 2022-10-11 DIAGNOSIS — Z124 Encounter for screening for malignant neoplasm of cervix: Secondary | ICD-10-CM | POA: Insufficient documentation

## 2022-10-11 DIAGNOSIS — Z0000 Encounter for general adult medical examination without abnormal findings: Secondary | ICD-10-CM | POA: Insufficient documentation

## 2022-10-11 DIAGNOSIS — Z0001 Encounter for general adult medical examination with abnormal findings: Secondary | ICD-10-CM

## 2022-10-11 DIAGNOSIS — E782 Mixed hyperlipidemia: Secondary | ICD-10-CM

## 2022-10-11 DIAGNOSIS — R4182 Altered mental status, unspecified: Secondary | ICD-10-CM

## 2022-10-11 NOTE — Assessment & Plan Note (Signed)
Continue Crestor 20mg  daily. Will recheck CMP and lipids in 1 month. Counseled on importance of heart healthy diet and 150 minutes of exercise weekly. She has reduced her intake of sweets.

## 2022-10-11 NOTE — Assessment & Plan Note (Signed)
Symptoms improving. Patient has resumed driving. Has an MRI scheduled next month.

## 2022-10-11 NOTE — Assessment & Plan Note (Signed)
3-5 minute discussion regarding the harms of tobacco use, the benefits of cessation, and methods of cessation. Discussed that there are medication options to help with cessation. Provided printed education on steps to quit smoking. Patient is not ready for a medication to help.  

## 2022-10-11 NOTE — Assessment & Plan Note (Addendum)
Today your medical history was reviewed and routine physical exam with labs was performed. Recommend 150 minutes of moderate intensity exercise weekly and consuming a well-balanced diet. Advised to stop smoking if a smoker, avoid smoking if a non-smoker, limit alcohol consumption to 1 drink per day for women and 2 drinks per day for men, and avoid illicit drug use. Counseled on safe sex practices and offered STI testing today. Counseled on the importance of sunscreen use. Counseled in mental health awareness and when to seek medical care. Vaccine maintenance discussed. Appropriate health maintenance items reviewed. Return to office in 1 year for annual physical exam. Shingles vaccine administered today, she declined Tdap

## 2022-10-11 NOTE — Progress Notes (Signed)
Complete physical exam  Patient: Bethany Mills   DOB: Nov 21, 1960   62 y.o. Female  MRN: [id number]  Subjective:    Chief Complaint  Patient presents with   Annual Exam    Pt here for pap and check-up    Bethany Mills is a 62 y.o. female who presents today for a complete physical exam. She reports consuming a  low sugar  diet. Home exercise routine includes walking 0.5. hrs per day. She generally feels well. She reports sleeping well with trazodone 50mg  nightly. She does not have additional problems to discuss today.    Most recent fall risk assessment:    10/11/2022   10:54 AM  Fall Risk   Falls in the past year? 0  Number falls in past yr: 0  Injury with Fall? 0  Risk for fall due to : No Fall Risks  Follow up Falls prevention discussed     Most recent depression screenings:    10/11/2022   10:54 AM 10/03/2022   11:34 AM  PHQ 2/9 Scores  PHQ - 2 Score 0 1  PHQ- 9 Score 0 2    Vision:Within last year and Dental: No current dental problems  Past Medical History:  Diagnosis Date   Allergies    Anxiety    COPD (chronic obstructive pulmonary disease) (HCC)    Hyperglycemic coma (HCC)    Hypertension    Past Surgical History:  Procedure Laterality Date   ABDOMINAL HYSTERECTOMY     Social History   Tobacco Use   Smoking status: Every Day    Packs/day: 1.00    Years: 15.00    Additional pack years: 0.00    Total pack years: 15.00    Types: Cigarettes   Smokeless tobacco: Never  Substance Use Topics   Alcohol use: No   Drug use: No   Family History  Problem Relation Age of Onset   Heart disease Mother    Hypertension Mother    COPD Mother    Cancer Mother    Heart disease Father    Hypertension Father    Cancer Maternal Grandmother    Diabetes Paternal Grandmother    Heart disease Paternal Grandfather    Allergies  Allergen Reactions   Codeine Nausea And Vomiting   Neomycin-Polymyxin B Gu Other (See Comments)    Causes eyes to swell, run  and crust over.    Neomycin-Bacitracin Zn-Polymyx Hives and Rash      Patient Care Team: Park Meo, FNP as PCP - General (Family Medicine)   Outpatient Medications Prior to Visit  Medication Sig   acetaminophen (TYLENOL) 500 MG tablet Take 2,000 mg by mouth every 6 (six) hours as needed. Pain    amLODipine (NORVASC) 10 MG tablet Take 1 tablet (10 mg total) by mouth daily.   aspirin 81 MG chewable tablet Chew 1 tablet (81 mg total) by mouth daily.   busPIRone (BUSPAR) 10 MG tablet Take 20 mg by mouth once.   rosuvastatin (CRESTOR) 20 MG tablet Take 1 tablet (20 mg total) by mouth daily.   ibuprofen (ADVIL,MOTRIN) 200 MG tablet Take 400 mg by mouth every 6 (six) hours as needed for pain. (Patient not taking: Reported on 10/03/2022)   No facility-administered medications prior to visit.    Review of Systems  Constitutional: Negative.   HENT: Negative.    Eyes: Negative.   Respiratory: Negative.    Cardiovascular: Negative.   Gastrointestinal: Negative.   Genitourinary: Negative.  Musculoskeletal: Negative.   Skin: Negative.   Neurological: Negative.   Endo/Heme/Allergies: Negative.   Psychiatric/Behavioral: Negative.    All other systems reviewed and are negative.         Objective:     BP 122/72   Pulse 76   Temp 97.6 F (36.4 C)   Ht 5\' 3"  (1.6 m)   Wt 147 lb 12.8 oz (67 kg)   SpO2 99%   BMI 26.18 kg/m  BP Readings from Last 3 Encounters:  10/11/22 122/72  10/03/22 130/70  09/18/22 (!) 152/84   Wt Readings from Last 3 Encounters:  10/11/22 147 lb 12.8 oz (67 kg)  10/03/22 150 lb (68 kg)  09/13/22 123 lb 14.4 oz (56.2 kg)      Physical Exam Vitals and nursing note reviewed. Exam conducted with a chaperone present.  Constitutional:      Appearance: Normal appearance. She is normal weight.  HENT:     Head: Normocephalic and atraumatic.     Right Ear: Tympanic membrane, ear canal and external ear normal.     Left Ear: Tympanic membrane, ear  canal and external ear normal.     Nose: Nose normal.     Mouth/Throat:     Mouth: Mucous membranes are moist.     Pharynx: Oropharynx is clear.  Eyes:     Extraocular Movements: Extraocular movements intact.     Conjunctiva/sclera: Conjunctivae normal.     Pupils: Pupils are equal, round, and reactive to light.  Cardiovascular:     Rate and Rhythm: Normal rate and regular rhythm.     Pulses: Normal pulses.     Heart sounds: Normal heart sounds.  Pulmonary:     Effort: Pulmonary effort is normal.     Breath sounds: Normal breath sounds.  Abdominal:     General: Bowel sounds are normal.     Palpations: Abdomen is soft.  Genitourinary:    General: Normal vulva.     Vagina: Normal.     Cervix: Normal.     Uterus: Normal.      Adnexa: Right adnexa normal and left adnexa normal.     Rectum: Normal.  Musculoskeletal:        General: Normal range of motion.     Cervical back: Normal range of motion and neck supple.  Skin:    General: Skin is warm and dry.     Capillary Refill: Capillary refill takes less than 2 seconds.  Neurological:     General: No focal deficit present.     Mental Status: She is alert and oriented to person, place, and time. Mental status is at baseline.  Psychiatric:        Mood and Affect: Mood normal.        Behavior: Behavior normal.        Thought Content: Thought content normal.        Judgment: Judgment normal.      No results found for any visits on 10/11/22. Last CBC Lab Results  Component Value Date   WBC 8.7 10/04/2022   HGB 13.7 10/04/2022   HCT 39.7 10/04/2022   MCV 85.9 10/04/2022   MCH 29.7 10/04/2022   RDW 12.5 10/04/2022   PLT 421 (H) 10/04/2022   Last metabolic panel Lab Results  Component Value Date   GLUCOSE 119 (H) 10/04/2022   NA 141 10/04/2022   K 4.2 10/04/2022   CL 102 10/04/2022   CO2 28 10/04/2022   BUN 12 10/04/2022  CREATININE 0.70 10/04/2022   EGFR 97 10/04/2022   CALCIUM 9.6 10/04/2022   PROT 7.4  10/04/2022   ALBUMIN 4.2 09/13/2022   BILITOT 0.4 10/04/2022   ALKPHOS 92 09/13/2022   AST 23 10/04/2022   ALT 37 (H) 10/04/2022   ANIONGAP 12 09/13/2022   Last lipids Lab Results  Component Value Date   CHOL 314 (H) 10/04/2022   HDL 42 (L) 10/04/2022   LDLCALC 240 (H) 10/04/2022   TRIG 158 (H) 10/04/2022   CHOLHDL 7.5 (H) 10/04/2022   Last hemoglobin A1c Lab Results  Component Value Date   HGBA1C 6.2 (A) 09/14/2022   Last thyroid functions Lab Results  Component Value Date   TSH 4.08 10/04/2022   Last vitamin D No results found for: "25OHVITD2", "25OHVITD3", "VD25OH" Last vitamin B12 and Folate No results found for: "VITAMINB12", "FOLATE"      Assessment & Plan:    Routine Health Maintenance and Physical Exam   There is no immunization history on file for this patient.  Health Maintenance  Topic Date Due   MAMMOGRAM  06/27/2017   Zoster Vaccines- Shingrix (1 of 2) 01/11/2023 (Originally 02/20/2009)   Colonoscopy  10/11/2023 (Originally 02/21/2004)   INFLUENZA VACCINE  12/07/2022   PAP SMEAR-Modifier  10/10/2025   Hepatitis C Screening  Completed   HIV Screening  Completed   HPV VACCINES  Aged Out   DTaP/Tdap/Td  Discontinued   COVID-19 Vaccine  Discontinued    Discussed health benefits of physical activity, and encouraged her to engage in regular exercise appropriate for her age and condition.  Problem List Items Addressed This Visit     Tobacco dependence    3-5 minute discussion regarding the harms of tobacco use, the benefits of cessation, and methods of cessation. Discussed that there are medication options to help with cessation. Provided printed education on steps to quit smoking. Patient is not ready for a medication to help.       Altered mental status    Symptoms improving. Patient has resumed driving. Has an MRI scheduled next month.      Physical exam, annual - Primary    Today your medical history was reviewed and routine physical exam  with labs was performed. Recommend 150 minutes of moderate intensity exercise weekly and consuming a well-balanced diet. Advised to stop smoking if a smoker, avoid smoking if a non-smoker, limit alcohol consumption to 1 drink per day for women and 2 drinks per day for men, and avoid illicit drug use. Counseled on safe sex practices and offered STI testing today. Counseled on the importance of sunscreen use. Counseled in mental health awareness and when to seek medical care. Vaccine maintenance discussed. Appropriate health maintenance items reviewed. Return to office in 1 year for annual physical exam. Shingles vaccine administered today, she declined Tdap       Cervical cancer screening   Relevant Orders   Pap, TP Imaging w/ CT/GC and w/ HPV RNA, rflx HPV Type 16/18   Mixed hyperlipidemia    Continue Crestor 20mg  daily. Will recheck CMP and lipids in 1 month. Counseled on importance of heart healthy diet and 150 minutes of exercise weekly. She has reduced her intake of sweets.       Return in about 4 weeks (around 11/08/2022) for chronic follow-up with labs 1 week prior.     Park Meo, FNP

## 2022-10-12 LAB — C. TRACHOMATIS/N. GONORRHOEAE RNA: N. gonorrhoeae RNA, TMA: NOT DETECTED

## 2022-10-15 ENCOUNTER — Other Ambulatory Visit: Payer: Self-pay | Admitting: Physician Assistant

## 2022-10-17 LAB — PAP, TP IMAGING W/ HPV RNA, RFLX HPV TYPE 16,18/45: HPV DNA High Risk: DETECTED — AB

## 2022-10-17 LAB — HPV TYPE 16 AND 18/45 RNA
HPV Type 16 RNA: NOT DETECTED
HPV Type 18/45 RNA: DETECTED — AB

## 2022-10-17 LAB — C. TRACHOMATIS/N. GONORRHOEAE RNA: C. trachomatis RNA, TMA: NOT DETECTED

## 2022-10-17 LAB — PAP, TP IMAGING W/ CT/GC AND W/ HPV RNA, RFLX HPV TYPE 16/18

## 2022-10-25 ENCOUNTER — Other Ambulatory Visit: Payer: Self-pay | Admitting: Physician Assistant

## 2022-10-30 ENCOUNTER — Other Ambulatory Visit: Payer: Self-pay | Admitting: Physician Assistant

## 2022-10-30 ENCOUNTER — Other Ambulatory Visit: Payer: Self-pay

## 2022-10-30 ENCOUNTER — Telehealth: Payer: Self-pay

## 2022-10-30 DIAGNOSIS — I10 Essential (primary) hypertension: Secondary | ICD-10-CM

## 2022-10-30 MED ORDER — AMLODIPINE BESYLATE 10 MG PO TABS
10.0000 mg | ORAL_TABLET | Freq: Every day | ORAL | 3 refills | Status: DC
Start: 2022-10-30 — End: 2023-01-18

## 2022-10-30 NOTE — Telephone Encounter (Signed)
Pt called in to request a new prescription of this med amLODipine (NORVASC) 10 MG tablet [[id number]]. Pt is completely out of this med.   Cb#: [phone]

## 2022-10-31 ENCOUNTER — Ambulatory Visit
Admission: RE | Admit: 2022-10-31 | Discharge: 2022-10-31 | Disposition: A | Discharge status: Home / Self Care | Payer: Medicaid Other | Source: Ambulatory Visit | Attending: Family Medicine | Admitting: Family Medicine

## 2022-10-31 DIAGNOSIS — Z1231 Encounter for screening mammogram for malignant neoplasm of breast: Secondary | ICD-10-CM

## 2022-11-01 ENCOUNTER — Other Ambulatory Visit: Payer: Self-pay | Admitting: Family Medicine

## 2022-11-01 DIAGNOSIS — R928 Other abnormal and inconclusive findings on diagnostic imaging of breast: Secondary | ICD-10-CM

## 2022-11-03 ENCOUNTER — Other Ambulatory Visit: Payer: Medicaid Other

## 2022-11-06 ENCOUNTER — Telehealth: Payer: Self-pay

## 2022-11-06 ENCOUNTER — Other Ambulatory Visit: Payer: MEDICAID

## 2022-11-06 DIAGNOSIS — I10 Essential (primary) hypertension: Secondary | ICD-10-CM

## 2022-11-06 LAB — LIPID PANEL
Cholesterol: 167 mg/dL (ref <200)
HDL: 48 mg/dL — ABNORMAL LOW (ref >=50)
LDL Cholesterol (Calc): 98 mg/dL (calc)
Non-HDL Cholesterol (Calc): 119 mg/dL (calc) (ref <130)
Total CHOL/HDL Ratio: 3.5 (calc) (ref <5.0)
Triglycerides: 110 mg/dL (ref <150)

## 2022-11-06 LAB — COMPLETE METABOLIC PANEL WITH GFR
AG Ratio: 1.4 (calc) (ref 1.0–2.5)
ALT: 24 U/L (ref 6–29)
AST: 18 U/L (ref 10–35)
Albumin: 4.4 g/dL (ref 3.6–5.1)
Alkaline phosphatase (APISO): 97 U/L (ref 37–153)
BUN: 14 mg/dL (ref 7–25)
CO2: 30 mmol/L (ref 20–32)
Calcium: 9.6 mg/dL (ref 8.6–10.4)
Chloride: 105 mmol/L (ref 98–110)
Creat: 0.69 mg/dL (ref 0.50–1.05)
Globulin: 3.1 g/dL (calc) (ref 1.9–3.7)
Glucose, Bld: 108 mg/dL — ABNORMAL HIGH (ref 65–99)
Potassium: 4.9 mmol/L (ref 3.5–5.3)
Sodium: 143 mmol/L (ref 135–146)
Total Bilirubin: 0.2 mg/dL (ref 0.2–1.2)
Total Protein: 7.5 g/dL (ref 6.1–8.1)
eGFR: 97 mL/min/{1.73_m2} (ref >=60)

## 2022-11-06 NOTE — Telephone Encounter (Signed)
Spoke w/Sarah w/[facility], regarding MM 3D DIAGNOSTIC MAMMOGRAM UNILATERAL LEFT BREAST (Order [id number])  . Fax order w/pcp's signature to [facility], Per Maralyn Sago that should be "ok" 11/06/22.

## 2022-11-07 ENCOUNTER — Other Ambulatory Visit: Payer: Self-pay | Admitting: Family Medicine

## 2022-11-07 ENCOUNTER — Ambulatory Visit
Admission: RE | Admit: 2022-11-07 | Discharge: 2022-11-07 | Disposition: A | Discharge status: Home / Self Care | Payer: MEDICAID | Source: Ambulatory Visit | Attending: Family Medicine | Admitting: Family Medicine

## 2022-11-07 ENCOUNTER — Ambulatory Visit
Admission: RE | Admit: 2022-11-07 | Discharge: 2022-11-07 | Disposition: A | Discharge status: Home / Self Care | Payer: Medicaid Other | Source: Ambulatory Visit | Attending: Family Medicine | Admitting: Family Medicine

## 2022-11-07 DIAGNOSIS — R928 Other abnormal and inconclusive findings on diagnostic imaging of breast: Secondary | ICD-10-CM

## 2022-11-08 ENCOUNTER — Encounter: Payer: Self-pay | Admitting: Family Medicine

## 2022-11-08 ENCOUNTER — Ambulatory Visit: Payer: MEDICAID | Admitting: Family Medicine

## 2022-11-08 VITALS — BP 115/70 | HR 91 | Temp 98.1°F | Ht 63.0 in | Wt 147.0 lb

## 2022-11-08 DIAGNOSIS — E782 Mixed hyperlipidemia: Secondary | ICD-10-CM

## 2022-11-08 NOTE — Assessment & Plan Note (Signed)
Continue Crestor 20mg  daily. Counseled on importance of heart healthy diet and 150 minutes of exercise weekly. She has reduced her intake of sweets. I recommend consuming a heart healthy diet such as Mediterranean diet or DASH diet with whole grains, fruits, vegetable, fish, lean meats, nuts, and olive oil. Limit sweets and processed foods. I also encourage moderate intensity exercise 150 minutes weekly. This is 3-5 times weekly for 30-50 minutes each session. Goal should be pace of 3 miles/hours, or walking 1.5 miles in 30 minutes. The 10-year ASCVD risk score (Arnett DK, et al., 2019) is: 9%

## 2022-11-08 NOTE — Progress Notes (Signed)
Subjective:  HPI: Bethany Mills is a 62 y.o. female presenting on 11/08/2022 for Follow-up (4 wks f/u lab work)   HPI Patient is in today for hyperlipidemia follow-up.   HYPERLIPIDEMIA Hyperlipidemia status: excellent compliance Satisfied with current treatment?  yes Side effects:  no Medication compliance: excellent compliance Past cholesterol meds: rosuvastatin (crestor) Supplements: none Aspirin:  yes The 10-year ASCVD risk score (Arnett DK, et al., 2019) is: 9%   Values used to calculate the score:     Age: 76 years     Sex: Female     Is Non-Hispanic African American: No     Diabetic: No     Tobacco smoker: Yes     Systolic Blood Pressure: 115 mmHg     Is BP treated: Yes     HDL Cholesterol: 48 mg/dL     Total Cholesterol: 167 mg/dL Chest pain:  no Coronary artery disease:  no Family history CAD:  no Family history early CAD:  no   Review of Systems  All other systems reviewed and are negative.   Relevant past medical history reviewed and updated as indicated.   Past Medical History:  Diagnosis Date   Allergies    Anxiety    COPD (chronic obstructive pulmonary disease) (HCC)    Hyperglycemic coma (HCC)    Hypertension      Past Surgical History:  Procedure Laterality Date   ABDOMINAL HYSTERECTOMY      Allergies and medications reviewed and updated.   Current Outpatient Medications:    acetaminophen (TYLENOL) 500 MG tablet, Take 2,000 mg by mouth every 6 (six) hours as needed. Pain , Disp: , Rfl:    amLODipine (NORVASC) 10 MG tablet, Take 1 tablet (10 mg total) by mouth daily., Disp: 30 tablet, Rfl: 3   aspirin 81 MG chewable tablet, Chew 1 tablet (81 mg total) by mouth daily., Disp: 30 tablet, Rfl: 0   busPIRone (BUSPAR) 10 MG tablet, Take 20 mg by mouth once., Disp: , Rfl:    rosuvastatin (CRESTOR) 20 MG tablet, Take 1 tablet (20 mg total) by mouth daily., Disp: 90 tablet, Rfl: 3   ibuprofen (ADVIL,MOTRIN) 200 MG tablet, Take 400 mg by mouth  every 6 (six) hours as needed for pain. (Patient not taking: Reported on 10/03/2022), Disp: , Rfl:   Allergies  Allergen Reactions   Codeine Nausea And Vomiting   Neomycin-Polymyxin B Gu Other (See Comments)    Causes eyes to swell, run and crust over.    Neomycin-Bacitracin Zn-Polymyx Hives and Rash    Objective:   BP 115/70   Pulse 91   Temp 98.1 F (36.7 C) (Oral)   Ht 5\' 3"  (1.6 m)   Wt 147 lb (66.7 kg)   SpO2 95%   BMI 26.04 kg/m      11/08/2022   10:53 AM 10/11/2022   10:49 AM 10/03/2022   11:19 AM  Vitals with BMI  Height 5\' 3"  5\' 3"  5\' 3"   Weight 147 lbs 147 lbs 13 oz 150 lbs  BMI 26.05 26.19 26.58  Systolic 115 122 161  Diastolic 70 72 70  Pulse 91 76 70     Physical Exam Vitals and nursing note reviewed.  Constitutional:      Appearance: Normal appearance. She is normal weight.  HENT:     Head: Normocephalic and atraumatic.  Cardiovascular:     Rate and Rhythm: Normal rate and regular rhythm.     Pulses: Normal pulses.  Heart sounds: Normal heart sounds.  Pulmonary:     Effort: Pulmonary effort is normal.     Breath sounds: Normal breath sounds.  Skin:    General: Skin is warm and dry.  Neurological:     General: No focal deficit present.     Mental Status: She is alert and oriented to person, place, and time. Mental status is at baseline.  Psychiatric:        Mood and Affect: Mood normal.        Behavior: Behavior normal.        Thought Content: Thought content normal.        Judgment: Judgment normal.     Assessment & Plan:  Mixed hyperlipidemia Assessment & Plan: Continue Crestor 20mg  daily. Counseled on importance of heart healthy diet and 150 minutes of exercise weekly. She has reduced her intake of sweets. I recommend consuming a heart healthy diet such as Mediterranean diet or DASH diet with whole grains, fruits, vegetable, fish, lean meats, nuts, and olive oil. Limit sweets and processed foods. I also encourage moderate intensity  exercise 150 minutes weekly. This is 3-5 times weekly for 30-50 minutes each session. Goal should be pace of 3 miles/hours, or walking 1.5 miles in 30 minutes. The 10-year ASCVD risk score (Arnett DK, et al., 2019) is: 9%       Follow up plan: Return in about 6 months (around 05/11/2023) for chronic follow-up with labs 1 week prior.  Park Meo, FNP

## 2022-11-13 ENCOUNTER — Ambulatory Visit
Admission: RE | Admit: 2022-11-13 | Discharge: 2022-11-13 | Disposition: A | Discharge status: Home / Self Care | Payer: MEDICAID | Source: Ambulatory Visit | Attending: Family Medicine

## 2022-11-13 DIAGNOSIS — R928 Other abnormal and inconclusive findings on diagnostic imaging of breast: Secondary | ICD-10-CM

## 2022-11-13 DIAGNOSIS — N6321 Unspecified lump in the left breast, upper outer quadrant: Secondary | ICD-10-CM

## 2022-11-13 HISTORY — PX: BREAST BIOPSY: SHX20

## 2022-11-15 ENCOUNTER — Other Ambulatory Visit: Payer: Self-pay | Admitting: Family Medicine

## 2022-11-15 ENCOUNTER — Telehealth: Payer: Self-pay | Admitting: Hematology and Oncology

## 2022-11-15 ENCOUNTER — Telehealth: Payer: Self-pay | Admitting: Family Medicine

## 2022-11-15 DIAGNOSIS — F064 Anxiety disorder due to known physiological condition: Secondary | ICD-10-CM

## 2022-11-15 MED ORDER — ALPRAZOLAM 0.25 MG PO TABS: 0.2500 mg | ORAL_TABLET | Freq: Two times a day (BID) | ORAL | 0 refills | Status: DC | PRN

## 2022-11-15 NOTE — Telephone Encounter (Signed)
Spoke to patient to confirm upcoming afternoon [facility] clinic appointment on 7/17, paperwork will be sent via mail.   Gave location and time, also informed patient that the surgeon's office would be calling as well to get information from them similar to the packet that they will be receiving so make sure to do both.  Reminded patient that all providers will be coming to the clinic to see them HERE and if they had any questions to not hesitate to reach back out to myself or their navigators.

## 2022-11-15 NOTE — Telephone Encounter (Signed)
Spoke w/pt and just dx with breast cancer and she has been anxious since the news and pt would like something to calm her nerves.   Pls advice?

## 2022-11-15 NOTE — Telephone Encounter (Signed)
Patient called in states she was dx with breast cancer yesterday and would like something called in for her nerves. She uses [facility] on [address].  CB# [phone]

## 2022-11-16 ENCOUNTER — Other Ambulatory Visit: Payer: Medicaid Other

## 2022-11-16 NOTE — Telephone Encounter (Signed)
Spoke w/pt, aware Rx has been sent to pharmacy and pcp's recommendations. Nothing further.

## 2022-11-20 ENCOUNTER — Encounter: Payer: Self-pay | Admitting: *Deleted

## 2022-11-20 DIAGNOSIS — C50412 Malignant neoplasm of upper-outer quadrant of left female breast: Secondary | ICD-10-CM | POA: Insufficient documentation

## 2022-11-21 ENCOUNTER — Encounter: Payer: Self-pay | Admitting: Family Medicine

## 2022-11-21 ENCOUNTER — Ambulatory Visit (INDEPENDENT_AMBULATORY_CARE_PROVIDER_SITE_OTHER): Payer: MEDICAID | Admitting: Family Medicine

## 2022-11-21 VITALS — BP 120/70 | HR 88 | Temp 97.8°F | Ht 63.0 in | Wt 146.0 lb

## 2022-11-21 DIAGNOSIS — F418 Other specified anxiety disorders: Secondary | ICD-10-CM

## 2022-11-21 MED ORDER — ALPRAZOLAM 0.5 MG PO TABS: 0.5000 mg | ORAL_TABLET | Freq: Three times a day (TID) | ORAL | 0 refills | Status: DC | PRN

## 2022-11-21 MED ORDER — BUSPIRONE HCL 10 MG PO TABS: 30.0000 mg | ORAL_TABLET | Freq: Once | ORAL | 0 refills | Status: AC

## 2022-11-21 NOTE — Patient Instructions (Signed)
We are increasing your Xanax to 0.5mg  tablets. Start with 1 tablet up to 3 times a day and if this is ineffective you may take up to 2 tablets 3 times a day. We are also increasing your Buspar to 30mg  (3 tablets) daily.

## 2022-11-21 NOTE — Progress Notes (Signed)
Subjective:  HPI: Bethany Mills is a 62 y.o. female presenting on 11/21/2022 for Follow-up (F/u and med mgmnt)   HPI Patient is in today for uncontrolled anxiety related to recent findings of left breast mass. She reports difficulty focusing, concentrating, and completing tasks. She does have good family support and is seeking to restart seeing her therapist. She has been taking Buspar 20mg  daily for some time and was start on Xanax 0.25mg  BID PRN. She has been taking 0.5mg  Xanax daily without improvement in symptoms. She denies SI/HI.  Review of Systems  All other systems reviewed and are negative.   Relevant past medical history reviewed and updated as indicated.   Past Medical History:  Diagnosis Date   Allergies    Anxiety    COPD (chronic obstructive pulmonary disease) (HCC)    Hyperglycemic coma (HCC)    Hypertension      Past Surgical History:  Procedure Laterality Date   ABDOMINAL HYSTERECTOMY     BREAST BIOPSY Left 11/13/2022   [place] LT BREAST BX W LOC DEV 1ST LESION IMG BX SPEC US GUIDE 11/13/2022 [facility]    Allergies and medications reviewed and updated.   Current Outpatient Medications:    acetaminophen (TYLENOL) 500 MG tablet, Take 2,000 mg by mouth every 6 (six) hours as needed. Pain , Disp: , Rfl:    amLODipine (NORVASC) 10 MG tablet, Take 1 tablet (10 mg total) by mouth daily., Disp: 30 tablet, Rfl: 3   aspirin 81 MG chewable tablet, Chew 1 tablet (81 mg total) by mouth daily., Disp: 30 tablet, Rfl: 0   rosuvastatin (CRESTOR) 20 MG tablet, Take 1 tablet (20 mg total) by mouth daily., Disp: 90 tablet, Rfl: 3   ALPRAZolam (XANAX) 0.5 MG tablet, Take 1-2 tablets (0.5-1 mg total) by mouth 3 (three) times daily as needed for anxiety., Disp: 90 tablet, Rfl: 0   busPIRone (BUSPAR) 10 MG tablet, Take 3 tablets (30 mg total) by mouth once for 1 dose., Disp: 3 tablet, Rfl: 0   ibuprofen (ADVIL,MOTRIN) 200 MG tablet, Take 400 mg by mouth every 6 (six) hours as  needed for pain. (Patient not taking: Reported on 11/21/2022), Disp: , Rfl:   Allergies  Allergen Reactions   Codeine Nausea And Vomiting   Neomycin-Polymyxin B Gu Other (See Comments)    Causes eyes to swell, run and crust over.    Neomycin-Bacitracin Zn-Polymyx Hives and Rash    Objective:   BP 120/70   Pulse 88   Temp 97.8 F (36.6 C) (Oral)   Ht 5\' 3"  (1.6 m)   Wt 146 lb (66.2 kg)   SpO2 93%   BMI 25.86 kg/m      11/21/2022    8:04 AM 11/08/2022   10:53 AM 10/11/2022   10:49 AM  Vitals with BMI  Height 5\' 3"  5\' 3"  5\' 3"   Weight 146 lbs 147 lbs 147 lbs 13 oz  BMI 25.87 26.05 26.19  Systolic 120 115 161  Diastolic 70 70 72  Pulse 88 91 76     Physical Exam Vitals and nursing note reviewed.  Constitutional:      Appearance: Normal appearance. She is normal weight.  HENT:     Head: Normocephalic and atraumatic.  Skin:    General: Skin is warm and dry.  Neurological:     General: No focal deficit present.     Mental Status: She is alert and oriented to person, place, and time. Mental status is at baseline.  Psychiatric:        Mood and Affect: Mood normal. Affect is tearful.        Behavior: Behavior normal.        Thought Content: Thought content normal.        Judgment: Judgment normal.     Assessment & Plan:  Situational anxiety Assessment & Plan: Encouraged to schedule appointment with therapist soon. Will increase Buspar to 30mg  daily and if ineffective can further increase to 40mg  daily after 1-2 weeks. Increase Xanax to 0.5-1mg  TID PRN, start with 0.5mg  TID PRN and if ineffective may take 1mg . Advised on risks of psychoactive medication use to include increased sedation, respiratory suppression, falls, extrapyramidal movements, dependence and cardiovascular events. Pt would like to continue treatment as benefit determined to outweigh risk. Return to office if symptoms persist or worsen. GAD 18.    10/03/2022   11:34 AM  GAD 7 : Generalized Anxiety Score   Nervous, Anxious, on Edge 0  Control/stop worrying 0  Worry too much - different things 0  Trouble relaxing 0  Restless 0  Easily annoyed or irritable 0  Afraid - awful might happen 0  Total GAD 7 Score 0       Other orders -     busPIRone HCl; Take 3 tablets (30 mg total) by mouth once for 1 dose.  Dispense: 3 tablet; Refill: 0 -     ALPRAZolam; Take 1-2 tablets (0.5-1 mg total) by mouth 3 (three) times daily as needed for anxiety.  Dispense: 90 tablet; Refill: 0     Follow up plan: Return if symptoms worsen or fail to improve.  Park Meo, FNP

## 2022-11-21 NOTE — Assessment & Plan Note (Signed)
Encouraged to schedule appointment with therapist soon. Will increase Buspar to 30mg  daily and if ineffective can further increase to 40mg  daily after 1-2 weeks. Increase Xanax to 0.5-1mg  TID PRN, start with 0.5mg  TID PRN and if ineffective may take 1mg . Advised on risks of psychoactive medication use to include increased sedation, respiratory suppression, falls, extrapyramidal movements, dependence and cardiovascular events. Pt would like to continue treatment as benefit determined to outweigh risk. Return to office if symptoms persist or worsen. GAD 18.    10/03/2022   11:34 AM  GAD 7 : Generalized Anxiety Score  Nervous, Anxious, on Edge 0  Control/stop worrying 0  Worry too much - different things 0  Trouble relaxing 0  Restless 0  Easily annoyed or irritable 0  Afraid - awful might happen 0  Total GAD 7 Score 0

## 2022-11-21 NOTE — Progress Notes (Addendum)
Radiation Oncology         [phone] ________________________________  Initial Outpatient Consultation  Name: Bethany Mills MRN: [id number]  Date: 11/22/2022  DOB: 10/19/1960  CC:Park Meo, FNP  Emelia Loron, MD   REFERRING PHYSICIAN: Emelia Loron, MD  DIAGNOSIS: [phone]   ICD-10-CM   1. Malignant neoplasm of upper-outer quadrant of left breast in female, estrogen receptor positive [facility])  C50.412    Z17.0        Cancer Staging  Malignant neoplasm of upper-outer quadrant of left breast in female, estrogen receptor positive (HCC) Staging form: Breast, AJCC 8th Edition - Clinical stage from 11/22/2022: Stage IA (cT1c, cN0, cM0, G1, ER+, PR+, HER2-) - Signed by Serena Croissant, MD on 11/22/2022 Stage prefix: Initial diagnosis Histologic grading system: 3 grade system Laterality: Left Staged by: Pathologist and managing physician National guidelines used in treatment planning: Yes Type of national guideline used in treatment planning: NCCN  Stage 1A Left Breast UOQ, 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 female who presented with a left breast abnormality on the following imaging: bilateral screening mammogram on the date of 10/31/22. No symptoms, if any, were reported at that time.   Diagnostic left breast mammogram and left breast ultrasound on 11/07/22 showed a suspicious 1.2 cm mass in the 12 o'clock left breast, 4 cmfn. No evidence of left axillary lymphadenopathy was appreciated.    Biopsy of the 12 o'clock left breast on date of 11/13/22 showed grade 1 invasive ductal carcinoma measuring 5 mm in the greatest linear extent of the sample, with focal intermediate grade DCIS and calcifications. ER status: 100% positive with strong staining intensity; PR status 85% positive with  moderate-strong staining intensity; Proliferation marker Ki67 at 10%; Her2 status negative; Grade 1.  No lymph nodes were examined.  Her case was discussed at our multidisciplinary breast clinic this morning. We are seeing her today to discuss adjuvant radiation therapy.   PREVIOUS RADIATION THERAPY: No  PAST MEDICAL HISTORY:  has a past medical history of Allergies, Anxiety, COPD (chronic obstructive pulmonary disease) (HCC), Hyperglycemic coma (HCC), and Hypertension.    PAST SURGICAL HISTORY: Past Surgical History:  Procedure Laterality Date   ABDOMINAL HYSTERECTOMY     BREAST BIOPSY Left 11/13/2022   [place] LT BREAST BX W LOC DEV 1ST LESION IMG BX SPEC US GUIDE 11/13/2022 [facility]    FAMILY HISTORY: family history includes Breast cancer in her mother; COPD in her mother; Cancer in her maternal grandmother and mother; Diabetes in her paternal grandmother; Heart disease in her father, mother, and paternal grandfather; Hypertension in her father and mother.  SOCIAL HISTORY:  reports that she has been smoking cigarettes. She has a 15 pack-year smoking history. She has never used smokeless tobacco. She reports that she does not drink alcohol and does not use drugs.  ALLERGIES: Codeine, Neomycin-polymyxin b gu, and Neomycin-bacitracin zn-polymyx  MEDICATIONS:  Current Outpatient Medications  Medication Sig Dispense Refill   acetaminophen (TYLENOL) 500 MG tablet Take 2,000 mg by mouth every 6 (six) hours as needed. Pain  (Patient not taking: Reported on 11/22/2022)     ALPRAZolam (XANAX) 0.5 MG tablet Take 1-2 tablets (0.5-1 mg total) by mouth 3 (three) times daily as needed for anxiety. 90 tablet 0   amLODipine (NORVASC) 10 MG tablet Take 1 tablet (10  mg total) by mouth daily. 30 tablet 3   aspirin 81 MG chewable tablet Chew 1 tablet (81 mg total) by mouth daily. 30 tablet 0   ibuprofen (ADVIL,MOTRIN) 200 MG tablet Take 400 mg by mouth every 6 (six) hours as needed for pain. (Patient not  taking: Reported on 11/21/2022)     rosuvastatin (CRESTOR) 20 MG tablet Take 1 tablet (20 mg total) by mouth daily. 90 tablet 3   No current facility-administered medications for this encounter.    REVIEW OF SYSTEMS: Patient verbalizes no specific breast complaints today. Patient is not able to feel her breast mass at this time.   PHYSICAL EXAM:  vitals were not taken for this visit.   In general this is a well appearing female in no acute distress. She's alert and oriented x4 and appropriate throughout the examination. Cardiopulmonary assessment is negative for acute distress and she exhibits normal effort.     Breast exam deferred by patient.    ECOG = 0  0 - Asymptomatic (Fully active, able to carry on all predisease activities without restriction)  1 - Symptomatic but completely ambulatory (Restricted in physically strenuous activity but ambulatory and able to carry out work of a light or sedentary nature. For example, light housework, office work)  2 - Symptomatic, <50% in bed during the day (Ambulatory and capable of all self care but unable to carry out any work activities. Up and about more than 50% of waking hours)  3 - Symptomatic, >50% in bed, but not bedbound (Capable of only limited self-care, confined to bed or chair 50% or more of waking hours)  4 - Bedbound (Completely disabled. Cannot carry on any self-care. Totally confined to bed or chair)  5 - Death   Santiago Glad MM, Creech RH, Tormey DC, et al. [phone]). "Toxicity and response criteria of the [facility]". Am. Evlyn Clines. Oncol. 5 (6): 649-55   LABORATORY DATA:  Lab Results  Component Value Date   WBC 8.7 11/22/2022   HGB 13.2 11/22/2022   HCT 38.9 11/22/2022   MCV 87.4 11/22/2022   PLT 304 11/22/2022   CMP     Component Value Date/Time   NA 140 11/22/2022 1221   K 3.4 (L) 11/22/2022 1221   CL 103 11/22/2022 1221   CO2 25 11/22/2022 1221   GLUCOSE 173 (H) 11/22/2022 1221   BUN 9  11/22/2022 1221   CREATININE 0.78 11/22/2022 1221   CREATININE 0.69 11/06/2022 0853   CALCIUM 9.8 11/22/2022 1221   PROT 8.1 11/22/2022 1221   ALBUMIN 4.5 11/22/2022 1221   AST 26 11/22/2022 1221   ALT 29 11/22/2022 1221   ALKPHOS 114 11/22/2022 1221   BILITOT 0.4 11/22/2022 1221   GFRNONAA >60 11/22/2022 1221   GFRAA >90 10/25/2012 1338         RADIOGRAPHY: [place] LT BREAST BX W LOC DEV 1ST LESION IMG BX SPEC US GUIDE  Addendum Date: 11/14/2022   ADDENDUM REPORT: 11/14/2022 15:36 ADDENDUM: Pathology revealed GRADE I INVASIVE DUCTAL CARCINOMA, FOCAL DUCTAL CARCINOMA IN SITU, CRIBRIFORM TYPE, NUCLEAR GRADE 2 OF 3, CALCIFICATIONS: FOCALLY PRESENT of the LEFT breast, 12 o'clock, 4 cmfn, (ribbon clip). This was found to be concordant by Dr. Frederico Hamman. Pathology results were discussed with the patient by telephone. The patient reported doing well after the biopsy with tenderness at the site. Post biopsy instructions and care were reviewed and questions were answered. The patient was encouraged to call [facility]  for any additional concerns. My direct phone number was provided. The patient was referred to [facility] on November 22, 2022. Consideration for a bilateral breast MRI for further evaluation of extent of disease given the density and complexity of her breast tissue. Pathology results reported by Rene Kocher, RN on 11/14/2022. Electronically Signed   By: Frederico Hamman M.D.   On: 11/14/2022 15:36   Result Date: 11/14/2022 CLINICAL DATA:  62 year old female presenting for ultrasound-guided biopsy of a left breast mass at 12 o'clock. EXAM: ULTRASOUND GUIDED LEFT BREAST CORE NEEDLE BIOPSY COMPARISON:  Previous exam(s). PROCEDURE: I met with the patient and we discussed the procedure of ultrasound-guided biopsy, including benefits and alternatives. We discussed the high likelihood of a  successful procedure. We discussed the risks of the procedure, including infection, bleeding, tissue injury, clip migration, and inadequate sampling. Informed written consent was given. The usual time-out protocol was performed immediately prior to the procedure. Lesion quadrant: Upper outer quadrant Using sterile technique and 1% Lidocaine as local anesthetic, under direct ultrasound visualization, a 14 gauge spring-loaded device was used to perform biopsy of a mass in the left breast at 12 o'clock, 4 cm from the nipple using a lateral approach. At the conclusion of the procedure a ribbon shaped tissue marker clip was deployed into the biopsy cavity. Follow up 2 view mammogram was performed and dictated separately. IMPRESSION: Ultrasound guided biopsy of a left breast mass at 12 o'clock. No apparent complications. Electronically Signed: By: Frederico Hamman M.D. On: 11/13/2022 11:15  MM CLIP PLACEMENT LEFT  Result Date: 11/13/2022 CLINICAL DATA:  Post biopsy mammogram of the left breast for clip placement. EXAM: 3D DIAGNOSTIC LEFT MAMMOGRAM POST ULTRASOUND BIOPSY COMPARISON:  Previous exam(s). FINDINGS: 3D Mammographic images were obtained following ultrasound guided biopsy of a left breast mass at 12 o'clock. The biopsy marking clip is in expected position at the site of biopsy. Of note, the patient has a prior coil shaped biopsy marking clip at the site of the stereotactic biopsy of calcifications at the site of a fibroadenoma. IMPRESSION: 1. Appropriate positioning of the ribbon shaped biopsy marking clip at the site of biopsy in the upper-outer left breast. 2. Of note, the patient has a coil shaped biopsy marking clip from a prior benign biopsy in the upper-outer left breast. Final Assessment: Post Procedure Mammograms for Marker Placement Electronically Signed   By: Frederico Hamman M.D.   On: 11/13/2022 11:25  MM 3D DIAGNOSTIC MAMMOGRAM UNILATERAL LEFT BREAST  Result Date: 11/07/2022 CLINICAL DATA:   Screening recall for a possible left breast mass. EXAM: DIGITAL DIAGNOSTIC UNILATERAL LEFT MAMMOGRAM WITH TOMOSYNTHESIS AND CAD; ULTRASOUND LEFT BREAST LIMITED TECHNIQUE: Left digital diagnostic mammography and breast tomosynthesis was performed. The images were evaluated with computer-aided detection. ; Targeted ultrasound examination of the left breast was performed. COMPARISON:  Previous exam(s). ACR Breast Density Category c: The breasts are heterogeneously dense, which may obscure small masses. FINDINGS: Spot compression tomosynthesis images through the superior posterior left breast demonstrates a prominent area of distortion in the far posterior breast. Ultrasound targeted to the left breast at 12 o'clock, 4 cm from the nipple demonstrates an irregular hypoechoic mass measuring 1.2 x 0.6 x 0.7 cm. Ultrasound of the left axilla demonstrates multiple normal-appearing lymph nodes. IMPRESSION: 1. There is a suspicious 1.2 cm mass in the left breast at 12 o'clock. 2.  No evidence of left axillary lymphadenopathy. RECOMMENDATION: Ultrasound guided biopsy is recommended  for the left breast mass at 12 o'clock. We will schedule the patient for this procedure at her earliest convenience. I have discussed the findings and recommendations with the patient. If applicable, a reminder letter will be sent to the patient regarding the next appointment. BI-RADS CATEGORY  5: Highly suggestive of malignancy. Electronically Signed   By: Frederico Hamman M.D.   On: 11/07/2022 15:45  [place] LIMITED ULTRASOUND INCLUDING AXILLA LEFT BREAST   Result Date: 11/07/2022 CLINICAL DATA:  Screening recall for a possible left breast mass. EXAM: DIGITAL DIAGNOSTIC UNILATERAL LEFT MAMMOGRAM WITH TOMOSYNTHESIS AND CAD; ULTRASOUND LEFT BREAST LIMITED TECHNIQUE: Left digital diagnostic mammography and breast tomosynthesis was performed. The images were evaluated with computer-aided detection. ; Targeted ultrasound examination of the left breast  was performed. COMPARISON:  Previous exam(s). ACR Breast Density Category c: The breasts are heterogeneously dense, which may obscure small masses. FINDINGS: Spot compression tomosynthesis images through the superior posterior left breast demonstrates a prominent area of distortion in the far posterior breast. Ultrasound targeted to the left breast at 12 o'clock, 4 cm from the nipple demonstrates an irregular hypoechoic mass measuring 1.2 x 0.6 x 0.7 cm. Ultrasound of the left axilla demonstrates multiple normal-appearing lymph nodes. IMPRESSION: 1. There is a suspicious 1.2 cm mass in the left breast at 12 o'clock. 2.  No evidence of left axillary lymphadenopathy. RECOMMENDATION: Ultrasound guided biopsy is recommended for the left breast mass at 12 o'clock. We will schedule the patient for this procedure at her earliest convenience. I have discussed the findings and recommendations with the patient. If applicable, a reminder letter will be sent to the patient regarding the next appointment. BI-RADS CATEGORY  5: Highly suggestive of malignancy. Electronically Signed   By: Frederico Hamman M.D.   On: 11/07/2022 15:45  MM 3D SCREENING MAMMOGRAM BILATERAL BREAST  Result Date: 10/31/2022 CLINICAL DATA:  Screening. EXAM: DIGITAL SCREENING BILATERAL MAMMOGRAM WITH TOMOSYNTHESIS AND CAD TECHNIQUE: Bilateral screening digital craniocaudal and mediolateral oblique mammograms were obtained. Bilateral screening digital breast tomosynthesis was performed. The images were evaluated with computer-aided detection. COMPARISON:  Previous exam(s). ACR Breast Density Category c: The breasts are heterogeneously dense, which may obscure small masses. FINDINGS: In the left breast, a possible mass warrants further evaluation. In the right breast, no findings suspicious for malignancy. IMPRESSION: Further evaluation is suggested for a possible mass in the left breast. RECOMMENDATION: Diagnostic mammogram and possibly ultrasound of  the left breast. (Code:[id number]) The patient will be contacted regarding the findings, and additional imaging will be scheduled. BI-RADS CATEGORY  0: Incomplete: Need additional imaging evaluation. Electronically Signed   By: Norva Pavlov M.D.   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   It was a pleasure meeting the patient today. We discussed the risks, benefits, and side effects of radiotherapy. I recommend radiotherapy to the left to reduce her risk of locoregional recurrence by 2/3.  We discussed that radiation would take approximately 4-6 weeks to complete and that I would give the patient a few weeks to heal following surgery before starting treatment planning. If chemotherapy were to be given, this would precede radiotherapy. We spoke about acute effects including skin irritation and fatigue as well as much less common late effects including internal organ injury or irritation. We spoke about the latest technology that is used to minimize the risk  of late effects for patients undergoing radiotherapy to the breast or chest wall. No guarantees of treatment were given. The patient is enthusiastic about proceeding with treatment. I look forward to participating in the patient's care.  I will await her referral back to me for postoperative follow-up and eventual CT simulation/treatment planning.  I asked the patient today about tobacco use. The patient uses tobacco.  I advised the patient to quit. Services were offered by me today including outpatient counseling and pharmacotherapy. I assessed for the willingness to attempt to quit and provided encouragement and demonstrated willingness to make referrals and/or prescriptions to help the patient attempt to quit. The patient has follow-up with the oncologic team to touch base on their tobacco use  and /or cessation efforts.  Over 3 minutes were spent on this issue.  She intends to think about this.   On date of service, in total, I spent 60 minutes on this encounter. Patient was seen in person. Note signed after encounter date; minutes pertain to date of service, only.  __________________________________________   Joyice Faster, PA-C    Lonie Peak, MD  This document serves as a record of services personally performed by Lonie Peak, MD. It was created on her behalf by Neena Rhymes, a trained medical scribe. The creation of this record is based on the scribe's personal observations and the provider's statements to them. This document has been checked and approved by the attending provider.

## 2022-11-22 ENCOUNTER — Ambulatory Visit
Admission: RE | Admit: 2022-11-22 | Discharge: 2022-11-22 | Disposition: A | Discharge status: Home / Self Care | Payer: MEDICAID | Source: Ambulatory Visit | Attending: Radiation Oncology | Admitting: Radiation Oncology

## 2022-11-22 ENCOUNTER — Telehealth: Payer: Self-pay | Admitting: Genetic Counselor

## 2022-11-22 ENCOUNTER — Ambulatory Visit: Payer: MEDICAID | Attending: General Surgery | Admitting: Physical Therapy

## 2022-11-22 ENCOUNTER — Inpatient Hospital Stay: Payer: MEDICAID | Attending: Hematology and Oncology | Admitting: Hematology and Oncology

## 2022-11-22 ENCOUNTER — Other Ambulatory Visit: Payer: Self-pay | Admitting: General Surgery

## 2022-11-22 ENCOUNTER — Other Ambulatory Visit: Payer: Self-pay

## 2022-11-22 ENCOUNTER — Encounter: Payer: Self-pay | Admitting: *Deleted

## 2022-11-22 ENCOUNTER — Encounter: Payer: Self-pay | Admitting: Radiology

## 2022-11-22 ENCOUNTER — Inpatient Hospital Stay: Payer: MEDICAID

## 2022-11-22 ENCOUNTER — Encounter: Payer: MEDICAID | Admitting: Genetic Counselor

## 2022-11-22 VITALS — BP 130/68 | HR 87 | Temp 97.7°F | Resp 18 | Ht 63.0 in | Wt 147.3 lb

## 2022-11-22 DIAGNOSIS — F17210 Nicotine dependence, cigarettes, uncomplicated: Secondary | ICD-10-CM | POA: Insufficient documentation

## 2022-11-22 DIAGNOSIS — Z809 Family history of malignant neoplasm, unspecified: Secondary | ICD-10-CM | POA: Diagnosis not present

## 2022-11-22 DIAGNOSIS — Z803 Family history of malignant neoplasm of breast: Secondary | ICD-10-CM | POA: Diagnosis not present

## 2022-11-22 DIAGNOSIS — Z79899 Other long term (current) drug therapy: Secondary | ICD-10-CM | POA: Diagnosis not present

## 2022-11-22 DIAGNOSIS — C50412 Malignant neoplasm of upper-outer quadrant of left female breast: Secondary | ICD-10-CM | POA: Insufficient documentation

## 2022-11-22 DIAGNOSIS — Z170 Estrogen receptor positive status [ER+]: Secondary | ICD-10-CM

## 2022-11-22 DIAGNOSIS — R293 Abnormal posture: Secondary | ICD-10-CM | POA: Diagnosis present

## 2022-11-22 LAB — CBC WITH DIFFERENTIAL (CANCER CENTER ONLY)
Abs Immature Granulocytes: 0.02 10*3/uL (ref 0.00–0.07)
Basophils Absolute: 0 10*3/uL (ref 0.0–0.1)
Basophils Relative: 1 %
Eosinophils Absolute: 0.2 10*3/uL (ref 0.0–0.5)
Eosinophils Relative: 2 %
HCT: 38.9 % (ref 36.0–46.0)
Hemoglobin: 13.2 g/dL (ref 12.0–15.0)
Immature Granulocytes: 0 %
Lymphocytes Relative: 23 %
Lymphs Abs: 2 10*3/uL (ref 0.7–4.0)
MCH: 29.7 pg (ref 26.0–34.0)
MCHC: 33.9 g/dL (ref 30.0–36.0)
MCV: 87.4 fL (ref 80.0–100.0)
Monocytes Absolute: 0.9 10*3/uL (ref 0.1–1.0)
Monocytes Relative: 11 %
Neutro Abs: 5.5 10*3/uL (ref 1.7–7.7)
Neutrophils Relative %: 63 %
Platelet Count: 304 10*3/uL (ref 150–400)
RBC: 4.45 MIL/uL (ref 3.87–5.11)
RDW: 13 % (ref 11.5–15.5)
WBC Count: 8.7 10*3/uL (ref 4.0–10.5)
nRBC: 0 % (ref 0.0–0.2)

## 2022-11-22 LAB — CMP (CANCER CENTER ONLY)
ALT: 29 U/L (ref 0–44)
AST: 26 U/L (ref 15–41)
Albumin: 4.5 g/dL (ref 3.5–5.0)
Alkaline Phosphatase: 114 U/L (ref 38–126)
Anion gap: 12 (ref 5–15)
BUN: 9 mg/dL (ref 8–23)
CO2: 25 mmol/L (ref 22–32)
Calcium: 9.8 mg/dL (ref 8.9–10.3)
Chloride: 103 mmol/L (ref 98–111)
Creatinine: 0.78 mg/dL (ref 0.44–1.00)
GFR, Estimated: >60 mL/min (ref >60)
Glucose, Bld: 173 mg/dL — ABNORMAL HIGH (ref 70–99)
Potassium: 3.4 mmol/L — ABNORMAL LOW (ref 3.5–5.1)
Sodium: 140 mmol/L (ref 135–145)
Total Bilirubin: 0.4 mg/dL (ref 0.3–1.2)
Total Protein: 8.1 g/dL (ref 6.5–8.1)

## 2022-11-22 LAB — GENETIC SCREENING ORDER

## 2022-11-22 NOTE — Progress Notes (Signed)
[facility] CONSULT NOTE  Patient Care Team: Park Meo, FNP as PCP - General (Family Medicine) Pershing Proud, RN as Oncology Nurse Navigator Donnelly Angelica, RN as Oncology Nurse Navigator Emelia Loron, MD as Consulting Physician (General Surgery) Lonie Peak, MD as Attending Physician (Radiation Oncology)  CHIEF COMPLAINTS/PURPOSE OF CONSULTATION:  Newly diagnosed breast cancer  HISTORY OF PRESENTING ILLNESS:  Bethany Mills 62 y.o. female is here because of recent diagnosis of left breast cancer.  Patient had a routine screening mammogram that detected left breast distortion at 4 o'clock position measuring 1.2 cm.  She was then taken to ultrasound-guided biopsy revealed grade 1 IDC that was ER/PR positive HER2 negative with a Ki67 10%.  She was presented this morning in the multidisciplinary tumor board and she is here today accompanied by her son to discuss treatment plan. She was crying and an extraordinary amount of stress and anxiety related to the diagnosis.  I reviewed her records extensively and collaborated the history with the patient.  SUMMARY OF ONCOLOGIC HISTORY: Oncology History  Malignant neoplasm of upper-outer quadrant of left breast in female, estrogen receptor positive (HCC)  11/13/2022 Initial Diagnosis   Screening mammogram detected left breast mass and distortion at 12 o'clock position measuring 1.2 cm, axilla negative, biopsy revealed grade 1 IDC with DCIS ER 100%, PR 85%, Ki67 10%, HER2 negative 1+      MEDICAL HISTORY:  Past Medical History:  Diagnosis Date   Allergies    Anxiety    COPD (chronic obstructive pulmonary disease) (HCC)    Hyperglycemic coma (HCC)    Hypertension     SURGICAL HISTORY: Past Surgical History:  Procedure Laterality Date   ABDOMINAL HYSTERECTOMY     BREAST BIOPSY Left 11/13/2022   [place] LT BREAST BX W LOC DEV 1ST LESION IMG BX SPEC US GUIDE 11/13/2022 [facility]MO[facility]    SOCIAL HISTORY: Social  History   Socioeconomic History   Marital status: Divorced    Spouse name: Not on file   Number of children: Not on file   Years of education: Not on file   Highest education level: Not on file  Occupational History   Not on file  Tobacco Use   Smoking status: Every Day    Current packs/day: 1.00    Average packs/day: 1 pack/day for 15.0 years (15.0 ttl pk-yrs)    Types: Cigarettes   Smokeless tobacco: Never  Substance and Sexual Activity   Alcohol use: No   Drug use: No   Sexual activity: Not Currently  Other Topics Concern   Not on file  Social History Narrative   Not on file   Social Determinants of Health   Financial Resource Strain: Not on file  Food Insecurity: Not on file  Transportation Needs: Not on file  Physical Activity: Not on file  Stress: Not on file  Social Connections: Not on file  Intimate Partner Violence: Not on file    FAMILY HISTORY: Family History  Problem Relation Age of Onset   Heart disease Mother    Hypertension Mother    COPD Mother    Cancer Mother    Breast cancer Mother    Heart disease Father    Hypertension Father    Cancer Maternal Grandmother    Diabetes Paternal Grandmother    Heart disease Paternal Grandfather     ALLERGIES:  is allergic to codeine, neomycin-polymyxin b gu, and neomycin-bacitracin zn-polymyx.  MEDICATIONS:  Current Outpatient Medications  Medication Sig  Dispense Refill   ALPRAZolam (XANAX) 0.5 MG tablet Take 1-2 tablets (0.5-1 mg total) by mouth 3 (three) times daily as needed for anxiety. 90 tablet 0   amLODipine (NORVASC) 10 MG tablet Take 1 tablet (10 mg total) by mouth daily. 30 tablet 3   aspirin 81 MG chewable tablet Chew 1 tablet (81 mg total) by mouth daily. 30 tablet 0   rosuvastatin (CRESTOR) 20 MG tablet Take 1 tablet (20 mg total) by mouth daily. 90 tablet 3   acetaminophen (TYLENOL) 500 MG tablet Take 2,000 mg by mouth every 6 (six) hours as needed. Pain  (Patient not taking: Reported on  11/22/2022)     ibuprofen (ADVIL,MOTRIN) 200 MG tablet Take 400 mg by mouth every 6 (six) hours as needed for pain. (Patient not taking: Reported on 11/21/2022)     No current facility-administered medications for this visit.    REVIEW OF SYSTEMS:   Constitutional: Denies fevers, chills or abnormal night sweats Breast:  Denies any palpable lumps or discharge All other systems were reviewed with the patient and are negative.  PHYSICAL EXAMINATION: ECOG PERFORMANCE STATUS: 1 - Symptomatic but completely ambulatory  Vitals:   11/22/22 1240  BP: 130/68  Pulse: 87  Resp: 18  Temp: 97.7 F (36.5 C)  SpO2: 97%   Filed Weights   11/22/22 1240  Weight: 147 lb 4.8 oz (66.8 kg)    GENERAL:alert, no distress and comfortable   LABORATORY DATA:  I have reviewed the data as listed Lab Results  Component Value Date   WBC 8.7 11/22/2022   HGB 13.2 11/22/2022   HCT 38.9 11/22/2022   MCV 87.4 11/22/2022   PLT 304 11/22/2022   Lab Results  Component Value Date   NA 140 11/22/2022   K 3.4 (L) 11/22/2022   CL 103 11/22/2022   CO2 25 11/22/2022    RADIOGRAPHIC STUDIES: I have personally reviewed the radiological reports and agreed with the findings in the report.  ASSESSMENT AND PLAN:  Malignant neoplasm of upper-outer quadrant of left breast in female, estrogen receptor positive (HCC) 11/13/2022:Screening mammogram detected left breast mass and distortion at 12 o'clock position measuring 1.2 cm, axilla negative, biopsy revealed grade 1 IDC with DCIS ER 100%, PR 85%, Ki67 10%, HER2 negative 1+  Pathology and radiology counseling:Discussed with the patient, the details of pathology including the type of breast cancer,the clinical staging, the significance of ER, PR and HER-2/neu receptors and the implications for treatment. After reviewing the pathology in detail, we proceeded to discuss the different treatment options between surgery, radiation, chemotherapy, antiestrogen  therapies.  Recommendations: 1. Breast conserving surgery followed by 2. we are not doing Oncotype DX testing because the patient is not interested in systemic chemotherapy. 3. Adjuvant radiation therapy followed by 4. Adjuvant antiestrogen therapy  Return to clinic after surgery to discuss final pathology report   All questions were answered. The patient knows to call the clinic with any problems, questions or concerns.    Tamsen Meek, MD 11/22/22

## 2022-11-22 NOTE — Research (Signed)
[employer] 2021-05 - Specimen Collection Study to Evaluate Biomarkers in Subjects with Cancer   11/22/22  CONSENT INTRO:  Patient Bethany Mills was identified by Dr. Pamelia Hoit as a potential candidate for the above listed study.  This Clinical Research Coordinator met with Bethany Mills, [id number], on 11/22/22 in a manner and location that ensures patient privacy to discuss participation in the above listed research study.  Patient is Accompanied by family .  A copy of the informed consent document with embedded HIPAA language was provided to the patient.  Patient reads, speaks, and understands [place].   Patient was provided with the business card of this Coordinator and encouraged to contact the research team with any questions.  Approximately 10 minutes were spent with the patient reviewing the informed consent documents.  Patient was provided the option of taking informed consent documents home to review and was encouraged to review at their convenience with their support network, including other care providers. Patient took the consent documents home to review. Patient would like to take consent home for review and is okay with a call next week to check on potential interest. Thanked patient for her time and consideration of the above mentioned study.   Merri Brunette, RT(R)(T) Clinical Research Coordinator

## 2022-11-22 NOTE — Assessment & Plan Note (Signed)
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 After reviewing the pathology in detail, we proceeded to discuss the different treatment options between surgery, radiation, chemotherapy, antiestrogen therapies.  Recommendations: 1. Breast conserving surgery followed by 2. we are not doing Oncotype DX testing because the patient is not interested in systemic chemotherapy. 3. Adjuvant radiation therapy followed by 4. Adjuvant antiestrogen therapy  Return to clinic after surgery to discuss final pathology report

## 2022-11-22 NOTE — Therapy (Signed)
OUTPATIENT PHYSICAL THERAPY BREAST CANCER BASELINE EVALUATION   Patient Name: Bethany Mills MRN: [id number] DOB:22-Nov-1960, 62 y.o., female Today's Date: 11/22/2022  END OF SESSION:  PT End of Session - 11/22/22 1451     Visit Number 1    Number of Visits 2    Date for PT Re-Evaluation 01/17/23    PT Start Time 1320    PT Stop Time 1350    PT Time Calculation (min) 30 min    Activity Tolerance Patient tolerated treatment well    Behavior During Therapy WFL for tasks assessed/performed             Past Medical History:  Diagnosis Date   Allergies    Anxiety    COPD (chronic obstructive pulmonary disease) (HCC)    Hyperglycemic coma (HCC)    Hypertension    Past Surgical History:  Procedure Laterality Date   ABDOMINAL HYSTERECTOMY     BREAST BIOPSY Left 11/13/2022   [place] LT BREAST BX W LOC DEV 1ST LESION IMG BX SPEC US GUIDE 11/13/2022 [facility]   Patient Active Problem List   Diagnosis Date Noted   Situational anxiety 11/21/2022   Malignant neoplasm of upper-outer quadrant of left breast in female, estrogen receptor positive (HCC) 11/20/2022   Physical exam, annual 10/11/2022   Cervical cancer screening 10/11/2022   Mixed hyperlipidemia 10/11/2022   Altered mental status 10/03/2022   Encounter to establish care with new doctor 10/03/2022   DDD (degenerative disc disease), cervical 02/21/2013   Tobacco dependence 02/21/2013   POST-TRAUMATIC HEADACHE UNSPECIFIED 03/08/2010   HEMATEMESIS 03/08/2010   BACK PAIN, LUMBAR 03/08/2010   ABDOMINAL PAIN, EPIGASTRIC 03/08/2010   CERVICAL STRAIN 03/08/2010   RECTAL BLEEDING 12/20/2009   WEAKNESS 12/20/2009   PELVIC  PAIN 12/20/2009   TUBULOVILLOUS ADENOMA, COLON, 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 Emelia Loron  REFERRING DIAG: Left breast cancer  THERAPY DIAG:  Malignant neoplasm of upper-outer quadrant of left breast in female, estrogen receptor positive (HCC)  Abnormal posture  Rationale  for Evaluation and Treatment: Rehabilitation  ONSET DATE: 10/31/2022  SUBJECTIVE:                                                                                                                                                                                           SUBJECTIVE STATEMENT: Patient reports she is here today to be seen by her medical team for her newly diagnosed left breast cancer.   PERTINENT HISTORY:  Patient was diagnosed on 10/31/2022 with left grade 1 invasive ductal carcinoma breast cancer. It measures 1.2 cm and is located in the upper outer  quadrant. It is ER/PR positive and HER2 negative with a Ki67 of 10%. She smokes 1 pack per day.  PATIENT GOALS:   reduce lymphedema risk and learn post op HEP.   PAIN:  Are you having pain? No  PRECAUTIONS: Active CA   RED FLAGS: None   HAND DOMINANCE: left  WEIGHT BEARING RESTRICTIONS: No  FALLS:  Has patient fallen in last 6 months? No  LIVING ENVIRONMENT: Patient lives with: her Father-in-Law Lives in: House/apartment Has following equipment at home: None  OCCUPATION: retired  LEISURE: She does not exercise  PRIOR LEVEL OF FUNCTION: Independent   OBJECTIVE:  COGNITION: Overall cognitive status: Within functional limits for tasks assessed    POSTURE:  Forward head and rounded shoulders posture  UPPER EXTREMITY AROM/PROM:  A/PROM RIGHT   eval   Shoulder extension 57  Shoulder flexion 150  Shoulder abduction 162  Shoulder internal rotation 60  Shoulder external rotation 84    (Blank rows = not tested)  A/PROM LEFT   eval  Shoulder extension 55  Shoulder flexion 149  Shoulder abduction 160  Shoulder internal rotation 54  Shoulder external rotation 86    (Blank rows = not tested)  CERVICAL AROM: All within normal limits:    Percent limited  Flexion WNL  Extension 50% limited  Right lateral flexion 50% limited  Left lateral flexion 50% limited  Right rotation 50% limited  Left rotation  50% limited    UPPER EXTREMITY STRENGTH: WNL  LYMPHEDEMA ASSESSMENTS:   LANDMARK RIGHT   eval  10 cm proximal to olecranon process 27.1  Olecranon process 23.8  10 cm proximal to ulnar styloid process 21  Just proximal to ulnar styloid process 15.4  Across hand at thumb web space 17.2  At base of 2nd digit 6  (Blank rows = not tested)  LANDMARK LEFT   eval  10 cm proximal to olecranon process 26.3  Olecranon process 24.2  10 cm proximal to ulnar styloid process 20.2  Just proximal to ulnar styloid process 15.9  Across hand at thumb web space 17.4  At base of 2nd digit 5.8  (Blank rows = not tested)  L-DEX LYMPHEDEMA SCREENING:  The patient was assessed using the L-Dex machine today to produce a lymphedema index baseline score. The patient will be reassessed on a regular basis (typically every 3 months) to obtain new L-Dex scores. If the score is > 6.5 points away from his/her baseline score indicating onset of subclinical lymphedema, it will be recommended to wear a compression garment for 4 weeks, 12 hours per day and then be reassessed. If the score continues to be > 6.5 points from baseline at reassessment, we will initiate lymphedema treatment. Assessing in this manner has a 95% rate of preventing clinically significant lymphedema.   L-DEX FLOWSHEETS - 11/22/22 1400       L-DEX LYMPHEDEMA SCREENING   Measurement Type Unilateral    L-DEX MEASUREMENT EXTREMITY Upper Extremity    POSITION  Standing    DOMINANT SIDE Right    At Risk Side Left    BASELINE SCORE (UNILATERAL) 3             QUICK DASH SURVEY:  Neldon Mc - 11/22/22 0001     Open a tight or new jar Unable    Do heavy household chores (wash walls, wash floors) Severe difficulty    Carry a shopping bag or briefcase No difficulty    Wash your back No difficulty    Use  a knife to cut food No difficulty    Recreational activities in which you take some force or impact through your arm, shoulder, or hand  (golf, hammering, tennis) Severe difficulty    During the past week, to what extent has your arm, shoulder or hand problem interfered with your normal social activities with family, friends, neighbors, or groups? Not at all    During the past week, to what extent has your arm, shoulder or hand problem limited your work or other regular daily activities Not at all    Arm, shoulder, or hand pain. None    Tingling (pins and needles) in your arm, shoulder, or hand None    Difficulty Sleeping No difficulty    DASH Score 22.73 %              PATIENT EDUCATION:  Education details: Lymphedema risk reduction and post op shoulder/posture HEP Person educated: Patient Education method: Explanation, Demonstration, Handout Education comprehension: Patient verbalized understanding and returned demonstration  HOME EXERCISE PROGRAM: Patient was instructed today in a home exercise program today for post op shoulder range of motion. These included active assist shoulder flexion in sitting, scapular retraction, wall walking with shoulder abduction, and hands behind head external rotation.  She was encouraged to do these twice a day, holding 3 seconds and repeating 5 times when permitted by her physician.   ASSESSMENT:  CLINICAL IMPRESSION: Patient was diagnosed on 10/31/2022 with left grade 1 invasive ductal carcinoma breast cancer. It measures 1.2 cm and is located in the upper outer quadrant. It is ER/PR positive and HER2 negative with a Ki67 of 10%. She smokes 1 pack per day.Her multidisciplinary medical team met prior to her assessments to determine a recommended treatment plan. She is planning to have a left lumpectomy and sentinel node biopsy followed by radiation and anti-estrogen therapy. She will benefit from a post op PT reassessment to determine needs and from L-Dex screens every 3 months for 2 years to detect subclinical lymphedema.  Pt will benefit from skilled therapeutic intervention to  improve on the following deficits: Decreased knowledge of precautions, impaired UE functional use, pain, decreased ROM, postural dysfunction.   PT treatment/interventions: ADL/self-care home management, pt/family education, therapeutic exercise  REHAB POTENTIAL: Excellent  CLINICAL DECISION MAKING: Stable/uncomplicated  EVALUATION COMPLEXITY: Low   GOALS: Goals reviewed with patient? YES  LONG TERM GOALS: (STG=LTG)    Name Target Date Goal status  1 Pt will be able to verbalize understanding of pertinent lymphedema risk reduction practices relevant to her dx specifically related to skin care.  Baseline:  No knowledge 11/22/2022 Achieved at eval  2 Pt will be able to return demo and/or verbalize understanding of the post op HEP related to regaining shoulder ROM. Baseline:  No knowledge 11/22/2022 Achieved at eval  3 Pt will be able to verbalize understanding of the importance of attending the post op After Breast CA Class for further lymphedema risk reduction education and therapeutic exercise.  Baseline:  No knowledge 11/22/2022 Achieved at eval  4 Pt will demo she has regained full shoulder ROM and function post operatively compared to baselines.  Baseline: See objective measurements taken today. 01/17/2023     PLAN:  PT FREQUENCY/DURATION: EVAL and 1 follow up appointment.   PLAN FOR NEXT SESSION: will reassess 3-4 weeks post op to determine needs.   Patient will follow up at outpatient cancer rehab 3-4 weeks following surgery.  If the patient requires physical therapy at that time, a specific plan  will be dictated and sent to the referring physician for approval. The patient was educated today on appropriate basic range of motion exercises to begin post operatively and the importance of attending the After Breast Cancer class following surgery.  Patient was educated today on lymphedema risk reduction practices as it pertains to recommendations that will benefit the patient  immediately following surgery.  She verbalized good understanding.    Physical Therapy Information for After Breast Cancer Surgery/Treatment:  Lymphedema is a swelling condition that you may be at risk for in your arm if you have lymph nodes removed from the armpit area.  After a sentinel node biopsy, the risk is approximately 5-9% and is higher after an axillary node dissection.  There is treatment available for this condition and it is not life-threatening.  Contact your physician or physical therapist with concerns. You may begin the 4 shoulder/posture exercises (see additional sheet) when permitted by your physician (typically a week after surgery).  If you have drains, you may need to wait until those are removed before beginning range of motion exercises.  A general recommendation is to not lift your arms above shoulder height until drains are removed.  These exercises should be done to your tolerance and gently.  This is not a "no pain/no gain" type of recovery so listen to your body and stretch into the range of motion that you can tolerate, stopping if you have pain.  If you are having immediate reconstruction, ask your plastic surgeon about doing exercises as he or she may want you to wait. We encourage you to attend the free one time ABC (After Breast Cancer) class offered by [facility].  You will learn information related to lymphedema risk, prevention and treatment and additional exercises to regain mobility following surgery.  You can call [phone] for more information.  This is offered the 1st and 3rd Monday of each month.  You only attend the class one time. While undergoing any medical procedure or treatment, try to avoid blood pressure being taken or needle sticks from occurring on the arm on the side of cancer.   This recommendation begins after surgery and continues for the rest of your life.  This may help reduce your risk of getting lymphedema (swelling in  your arm). An excellent resource for those seeking information on lymphedema is the National Lymphedema Network's web site. It can be accessed at www.lymphnet.org If you notice swelling in your hand, arm or breast at any time following surgery (even if it is many years from now), please contact your doctor or physical therapist to discuss this.  Lymphedema can be treated at any time but it is easier for you if it is treated early on.  If you feel like your shoulder motion is not returning to normal in a reasonable amount of time, please contact your surgeon or physical therapist.  [facility] [phone]. [address], [address], [place] [address]  ABC CLASS After Breast Cancer Class  After Breast Cancer Class is a specially designed exercise class to assist you in a safe recover after having breast cancer surgery.  In this class you will learn how to get back to full function whether your drains were just removed or if you had surgery a month ago.  This one-time class is held the 1st and 3rd Monday of every month from 11:00 a.m. until 12:00 noon virtually.  This class is FREE and space is limited.  For more information or to register for the next available class, call [phone].  Class Goals  Understand specific stretches to improve the flexibility of you chest and shoulder. Learn ways to safely strengthen your upper body and improve your posture. Understand the warning signs of infection and why you may be at risk for an arm infection. Learn about Lymphedema and prevention.  ** You do not attend this class until after surgery.  Drains must be removed to participate  Patient was instructed today in a home exercise program today for post op shoulder range of motion. These included active assist shoulder flexion in sitting, scapular retraction, wall walking with shoulder abduction, and hands behind head external rotation.  She was encouraged to do  these twice a day, holding 3 seconds and repeating 5 times when permitted by her physician.  Bethann Punches, [place] 11/22/22 2:57 PM

## 2022-11-22 NOTE — Telephone Encounter (Signed)
Bethany Mills was seen by a genetic counselor during the breast multidisciplinary clinic on November 22, 2022. In addition to her personal history of breast cancer, she reported a family history of breast cancer - all over the age of 67. She does not meet NCCN criteria for genetic testing at this time. She was still offered genetic counseling and testing but declined. We encourage her to contact us if there are any changes to her personal or family history of cancer. If she meets NCCN criteria based on the updated personal/family history, she would be recommended to have genetic counseling and testing.

## 2022-11-23 ENCOUNTER — Inpatient Hospital Stay: Payer: MEDICAID | Admitting: *Deleted

## 2022-11-23 ENCOUNTER — Telehealth: Payer: Self-pay

## 2022-11-23 ENCOUNTER — Encounter: Payer: Self-pay | Admitting: General Practice

## 2022-11-23 NOTE — Progress Notes (Signed)
[facility] CSW Progress Note  Clinical Child psychotherapist contacted patient by phone to check in for any emotional support needs following the [facility]. Client reported no needs at this time.    Marguerita Merles, LCSWA Clinical Social Worker [facility]

## 2022-11-23 NOTE — Progress Notes (Signed)
[facility] Multidisciplinary Clinic Spiritual Care Note  Met with Bethany Mills by phone following Breast Multidisciplinary Clinic to introduce Support Center team/resources.  she completed SDOH screening; results follow below.    SDOH Screenings   Food Insecurity: No Food Insecurity (11/23/2022)  Transportation Needs: No Transportation Needs (11/23/2022)  Depression (PHQ2-9): Low Risk  (11/21/2022)  Tobacco Use: High Risk (11/22/2022)   Received from [facility] and patient discussed common feelings and emotions when being diagnosed with cancer, and the importance of support during treatment.  Chaplain informed patient of the support team and support services at [facility].  Chaplain provided contact information and encouraged patient to call with any questions or concerns.  Follow up needed: No. Bethany Mills reports that she has "all the support [she] need[s]," and that she promises to reach out if she finds she needs or wants additional support, such as Spiritual Care or Sports coach.   [address] [place], [place], [facility] Pager [phone] Voicemail [phone]

## 2022-11-24 ENCOUNTER — Encounter: Payer: Self-pay | Admitting: Radiation Oncology

## 2022-11-27 ENCOUNTER — Encounter: Payer: Self-pay | Admitting: *Deleted

## 2022-11-27 ENCOUNTER — Telehealth: Payer: Self-pay | Admitting: Hematology and Oncology

## 2022-11-27 ENCOUNTER — Telehealth: Payer: Self-pay | Admitting: *Deleted

## 2022-11-27 ENCOUNTER — Other Ambulatory Visit: Payer: Self-pay | Admitting: General Surgery

## 2022-11-27 DIAGNOSIS — C50412 Malignant neoplasm of upper-outer quadrant of left female breast: Secondary | ICD-10-CM

## 2022-11-27 NOTE — Telephone Encounter (Signed)
Left message for a return phone call to follow up from [facility] 7/17 and assess navigation needs.

## 2022-11-27 NOTE — Telephone Encounter (Signed)
Scheduled appointment per scheduling message. Left voicemail for patient.  

## 2022-11-29 ENCOUNTER — Encounter (HOSPITAL_BASED_OUTPATIENT_CLINIC_OR_DEPARTMENT_OTHER): Payer: Self-pay | Admitting: General Surgery

## 2022-11-29 NOTE — Progress Notes (Signed)
Reviewed chart with Dr. Renold Don at [facility]. Okay to proceed with surgery as long as patients neuro symptoms from 09/13/22 have resolved and no new symptoms occur. Patient is doing appropriate follow up and compliance with her HTN and anxiety medications.

## 2022-11-30 NOTE — Telephone Encounter (Signed)
[facility] Clinical Social Work  Visual merchandiser attempted to contact patient by phone to offer support and assess for need following [facility]. Patient reported that she had no needs at the time of call.  Marguerita Merles, LCSWA Clinical Social Worker [facility]

## 2022-12-04 ENCOUNTER — Encounter: Payer: Self-pay | Admitting: Family Medicine

## 2022-12-04 ENCOUNTER — Ambulatory Visit: Payer: MEDICAID | Admitting: Family Medicine

## 2022-12-04 VITALS — BP 138/78 | HR 83 | Temp 97.8°F | Ht 63.0 in | Wt 147.0 lb

## 2022-12-04 DIAGNOSIS — L309 Dermatitis, unspecified: Secondary | ICD-10-CM | POA: Diagnosis not present

## 2022-12-04 MED ORDER — CLOTRIMAZOLE-BETAMETHASONE 1-0.05 % EX CREA: 1.0000 | TOPICAL_CREAM | Freq: Every day | CUTANEOUS | 0 refills | Status: DC

## 2022-12-04 NOTE — Assessment & Plan Note (Signed)
Start Lotrimin cream. May use warm compress for drainage. If symptoms persist or worsen return to office.

## 2022-12-04 NOTE — Progress Notes (Signed)
Subjective:  HPI: Bethany Mills is a 62 y.o. female presenting on 12/04/2022 for Acute Visit and Rash (rash on back of neck; seeping lots of yellow fluid - JBG\\\)   Rash   Patient is in today for a rash on the back of her neck for many months that is now oozing yellow fluid. It is itchy and painful. Has tried polybactin. Denies fever, body aches, chills, rash is not spreading. No known allergen exposure.  Review of Systems  Skin:  Positive for rash.  All other systems reviewed and are negative.   Relevant past medical history reviewed and updated as indicated.   Past Medical History:  Diagnosis Date   Allergies    Anxiety    COPD (chronic obstructive pulmonary disease) (HCC)    Hyperglycemic coma (HCC)    Hypertension      Past Surgical History:  Procedure Laterality Date   ABDOMINAL HYSTERECTOMY     BREAST BIOPSY Left 11/13/2022   [place] LT BREAST BX W LOC DEV 1ST LESION IMG BX SPEC US GUIDE 11/13/2022 [facility]    Allergies and medications reviewed and updated.   Current Outpatient Medications:    ALPRAZolam (XANAX) 0.5 MG tablet, Take 1-2 tablets (0.5-1 mg total) by mouth 3 (three) times daily as needed for anxiety., Disp: 90 tablet, Rfl: 0   amLODipine (NORVASC) 10 MG tablet, Take 1 tablet (10 mg total) by mouth daily., Disp: 30 tablet, Rfl: 3   aspirin 81 MG chewable tablet, Chew 1 tablet (81 mg total) by mouth daily., Disp: 30 tablet, Rfl: 0   busPIRone (BUSPAR) 5 MG tablet, Take 5 mg by mouth 2 (two) times daily., Disp: , Rfl:    clotrimazole-betamethasone (LOTRISONE) cream, Apply 1 Application topically daily., Disp: 30 g, Rfl: 0   rosuvastatin (CRESTOR) 20 MG tablet, Take 1 tablet (20 mg total) by mouth daily., Disp: 90 tablet, Rfl: 3  Allergies  Allergen Reactions   Codeine Nausea And Vomiting   Neomycin-Polymyxin B Gu Other (See Comments)    Causes eyes to swell, run and crust over.    Neomycin-Bacitracin Zn-Polymyx Hives and Rash    Objective:    BP 138/78   Pulse 83   Temp 97.8 F (36.6 C) (Oral)   Ht 5\' 3"  (1.6 m)   Wt 147 lb (66.7 kg)   SpO2 96%   BMI 26.04 kg/m      12/04/2022    3:30 PM 11/29/2022    1:48 PM 11/22/2022   12:40 PM  Vitals with BMI  Height 5\' 3"  5' 3.5" 5\' 3"   Weight 147 lbs 146 lbs 147 lbs 5 oz  BMI 26.05 25.45 26.1  Systolic 138  130  Diastolic 78  68  Pulse 83  87     Physical Exam Vitals and nursing note reviewed.  Constitutional:      Appearance: Normal appearance. She is normal weight.  HENT:     Head: Normocephalic and atraumatic.  Skin:    General: Skin is warm and dry.     Findings: Rash present. Rash is crusting and urticarial.     Comments: Serous drainage  Neurological:     General: No focal deficit present.     Mental Status: She is alert and oriented to person, place, and time. Mental status is at baseline.  Psychiatric:        Mood and Affect: Mood normal.        Behavior: Behavior normal.  Thought Content: Thought content normal.        Judgment: Judgment normal.     Assessment & Plan:  Dermatitis Assessment & Plan: Start Lotrimin cream. May use warm compress for drainage. If symptoms persist or worsen return to office.    Other orders -     Clotrimazole-Betamethasone; Apply 1 Application topically daily.  Dispense: 30 g; Refill: 0     Follow up plan: No follow-ups on file.  Park Meo, FNP

## 2022-12-05 ENCOUNTER — Encounter: Payer: Self-pay | Admitting: *Deleted

## 2022-12-05 ENCOUNTER — Ambulatory Visit
Admission: RE | Admit: 2022-12-05 | Discharge: 2022-12-05 | Disposition: A | Discharge status: Home / Self Care | Payer: MEDICAID | Source: Ambulatory Visit | Attending: General Surgery | Admitting: General Surgery

## 2022-12-05 DIAGNOSIS — Z170 Estrogen receptor positive status [ER+]: Secondary | ICD-10-CM

## 2022-12-05 HISTORY — PX: BREAST BIOPSY: SHX20

## 2022-12-05 MED ORDER — CHLORHEXIDINE GLUCONATE CLOTH 2 % EX PADS: 6.0000 | MEDICATED_PAD | Freq: Once | CUTANEOUS | Status: DC

## 2022-12-05 NOTE — Progress Notes (Signed)
       Patient Instructions  The night before surgery:  No food after midnight. ONLY clear liquids after midnight  The day of surgery (if you do NOT have diabetes):  Drink ONE (1) Pre-Surgery Clear Ensure as directed.   This drink was given to you during your hospital  pre-op appointment visit. The pre-op nurse will instruct you on the time to drink the  Pre-Surgery Ensure depending on your surgery time. Finish the drink at the designated time by the pre-op nurse.  Nothing else to drink after completing the  Pre-Surgery Clear Ensure.  The day of surgery (if you have diabetes): Drink ONE (1) Gatorade 2 (G2) as directed. This drink was given to you during your hospital  pre-op appointment visit.  The pre-op nurse will instruct you on the time to drink the   Gatorade 2 (G2) depending on your surgery time. Color of the Gatorade may vary. Red is not allowed. Nothing else to drink after completing the  Gatorade 2 (G2).         If you have questions, please contact your surgeon's office.  Surgical soap given to patient, instructions given, patient verbalized understanding.  

## 2022-12-06 ENCOUNTER — Ambulatory Visit (HOSPITAL_BASED_OUTPATIENT_CLINIC_OR_DEPARTMENT_OTHER): Payer: MEDICAID | Admitting: Anesthesiology

## 2022-12-06 ENCOUNTER — Ambulatory Visit
Admission: RE | Admit: 2022-12-06 | Discharge: 2022-12-06 | Disposition: A | Discharge status: Home / Self Care | Payer: MEDICAID | Source: Ambulatory Visit | Attending: General Surgery | Admitting: General Surgery

## 2022-12-06 ENCOUNTER — Ambulatory Visit (HOSPITAL_BASED_OUTPATIENT_CLINIC_OR_DEPARTMENT_OTHER)
Admission: RE | Admit: 2022-12-06 | Discharge: 2022-12-06 | Disposition: A | Discharge status: Home / Self Care | Payer: MEDICAID | Source: Ambulatory Visit | Attending: General Surgery | Admitting: General Surgery

## 2022-12-06 ENCOUNTER — Encounter (HOSPITAL_BASED_OUTPATIENT_CLINIC_OR_DEPARTMENT_OTHER): Payer: Self-pay | Admitting: General Surgery

## 2022-12-06 ENCOUNTER — Encounter (HOSPITAL_BASED_OUTPATIENT_CLINIC_OR_DEPARTMENT_OTHER)
Admission: RE | Disposition: A | Discharge status: Home / Self Care | Payer: Self-pay | Source: Ambulatory Visit | Attending: General Surgery

## 2022-12-06 ENCOUNTER — Other Ambulatory Visit: Payer: Self-pay

## 2022-12-06 DIAGNOSIS — Z170 Estrogen receptor positive status [ER+]: Secondary | ICD-10-CM

## 2022-12-06 DIAGNOSIS — C50912 Malignant neoplasm of unspecified site of left female breast: Secondary | ICD-10-CM

## 2022-12-06 DIAGNOSIS — F17210 Nicotine dependence, cigarettes, uncomplicated: Secondary | ICD-10-CM | POA: Diagnosis not present

## 2022-12-06 DIAGNOSIS — I10 Essential (primary) hypertension: Secondary | ICD-10-CM

## 2022-12-06 DIAGNOSIS — C773 Secondary and unspecified malignant neoplasm of axilla and upper limb lymph nodes: Secondary | ICD-10-CM | POA: Insufficient documentation

## 2022-12-06 DIAGNOSIS — J449 Chronic obstructive pulmonary disease, unspecified: Secondary | ICD-10-CM | POA: Diagnosis not present

## 2022-12-06 DIAGNOSIS — Z01818 Encounter for other preprocedural examination: Secondary | ICD-10-CM

## 2022-12-06 HISTORY — PX: BREAST LUMPECTOMY: SHX2

## 2022-12-06 HISTORY — PX: BREAST LUMPECTOMY WITH RADIOACTIVE SEED AND SENTINEL LYMPH NODE BIOPSY: SHX6550

## 2022-12-06 SURGERY — BREAST LUMPECTOMY WITH RADIOACTIVE SEED AND SENTINEL LYMPH NODE BIOPSY
Anesthesia: Regional | Site: Breast | Laterality: Left

## 2022-12-06 MED ORDER — FENTANYL CITRATE (PF) 100 MCG/2ML IJ SOLN
INTRAMUSCULAR | Status: AC
  Filled 2022-12-06: qty 2

## 2022-12-06 MED ORDER — MIDAZOLAM HCL 5 MG/5ML IJ SOLN
INTRAMUSCULAR | Status: DC | PRN
  Administered 2022-12-06: 1 mg via INTRAVENOUS

## 2022-12-06 MED ORDER — DEXAMETHASONE SODIUM PHOSPHATE 10 MG/ML IJ SOLN
INTRAMUSCULAR | Status: AC
  Filled 2022-12-06: qty 1

## 2022-12-06 MED ORDER — MIDAZOLAM HCL 2 MG/2ML IJ SOLN
INTRAMUSCULAR | Status: AC
  Filled 2022-12-06: qty 2

## 2022-12-06 MED ORDER — ENSURE PRE-SURGERY PO LIQD: 296.0000 mL | Freq: Once | ORAL | Status: DC

## 2022-12-06 MED ORDER — ROPIVACAINE HCL 5 MG/ML IJ SOLN
INTRAMUSCULAR | Status: DC | PRN
Start: 2022-12-06 — End: 2022-12-06
  Administered 2022-12-06: 30 mL via PERINEURAL

## 2022-12-06 MED ORDER — KETOROLAC TROMETHAMINE 30 MG/ML IJ SOLN
30.0000 mg | Freq: Once | INTRAMUSCULAR | Status: AC | PRN
  Administered 2022-12-06: 30 mg via INTRAVENOUS

## 2022-12-06 MED ORDER — CEFAZOLIN SODIUM-DEXTROSE 2-4 GM/100ML-% IV SOLN
2.0000 g | INTRAVENOUS | Status: AC
  Administered 2022-12-06: 2 g via INTRAVENOUS

## 2022-12-06 MED ORDER — SUCCINYLCHOLINE CHLORIDE 200 MG/10ML IV SOSY
PREFILLED_SYRINGE | INTRAVENOUS | Status: AC
  Filled 2022-12-06: qty 10

## 2022-12-06 MED ORDER — ONDANSETRON HCL 4 MG/2ML IJ SOLN
INTRAMUSCULAR | Status: DC | PRN
  Administered 2022-12-06: 4 mg via INTRAVENOUS

## 2022-12-06 MED ORDER — OXYCODONE HCL 5 MG PO TABS
5.0000 mg | ORAL_TABLET | Freq: Once | ORAL | Status: AC | PRN
  Administered 2022-12-06: 5 mg via ORAL

## 2022-12-06 MED ORDER — PHENYLEPHRINE 80 MCG/ML (10ML) SYRINGE FOR IV PUSH (FOR BLOOD PRESSURE SUPPORT)
PREFILLED_SYRINGE | INTRAVENOUS | Status: AC
  Filled 2022-12-06: qty 10

## 2022-12-06 MED ORDER — FENTANYL CITRATE (PF) 100 MCG/2ML IJ SOLN
INTRAMUSCULAR | Status: DC | PRN
  Administered 2022-12-06: 50 ug via INTRAVENOUS

## 2022-12-06 MED ORDER — FENTANYL CITRATE (PF) 100 MCG/2ML IJ SOLN
25.0000 ug | INTRAMUSCULAR | Status: DC | PRN
  Administered 2022-12-06: 25 ug via INTRAVENOUS

## 2022-12-06 MED ORDER — AMISULPRIDE (ANTIEMETIC) 5 MG/2ML IV SOLN: 10.0000 mg | Freq: Once | INTRAVENOUS | Status: DC | PRN

## 2022-12-06 MED ORDER — OXYCODONE HCL 5 MG PO TABS
ORAL_TABLET | ORAL | Status: AC
  Filled 2022-12-06: qty 1

## 2022-12-06 MED ORDER — LIDOCAINE 2% (20 MG/ML) 5 ML SYRINGE
INTRAMUSCULAR | Status: AC
  Filled 2022-12-06: qty 5

## 2022-12-06 MED ORDER — KETOROLAC TROMETHAMINE 30 MG/ML IJ SOLN
INTRAMUSCULAR | Status: AC
  Filled 2022-12-06: qty 1

## 2022-12-06 MED ORDER — BUPIVACAINE HCL (PF) 0.25 % IJ SOLN
INTRAMUSCULAR | Status: DC | PRN
  Administered 2022-12-06: 5 mL

## 2022-12-06 MED ORDER — PROPOFOL 500 MG/50ML IV EMUL
INTRAVENOUS | Status: DC | PRN
  Administered 2022-12-06: 200 ug/kg/min via INTRAVENOUS

## 2022-12-06 MED ORDER — ACETAMINOPHEN 500 MG PO TABS
1000.0000 mg | ORAL_TABLET | ORAL | Status: AC
  Administered 2022-12-06: 1000 mg via ORAL

## 2022-12-06 MED ORDER — ONDANSETRON HCL 4 MG/2ML IJ SOLN
INTRAMUSCULAR | Status: AC
  Filled 2022-12-06: qty 2

## 2022-12-06 MED ORDER — MIDAZOLAM HCL 2 MG/2ML IJ SOLN
2.0000 mg | Freq: Once | INTRAMUSCULAR | Status: AC
  Administered 2022-12-06: 2 mg via INTRAVENOUS

## 2022-12-06 MED ORDER — ATROPINE SULFATE 0.4 MG/ML IV SOLN
INTRAVENOUS | Status: AC
  Filled 2022-12-06: qty 1

## 2022-12-06 MED ORDER — OXYCODONE HCL 5 MG PO TABS: 5.0000 mg | ORAL_TABLET | Freq: Four times a day (QID) | ORAL | 0 refills | Status: DC | PRN

## 2022-12-06 MED ORDER — BUPIVACAINE HCL (PF) 0.25 % IJ SOLN
INTRAMUSCULAR | Status: AC
  Filled 2022-12-06: qty 30

## 2022-12-06 MED ORDER — CEFAZOLIN SODIUM-DEXTROSE 2-4 GM/100ML-% IV SOLN
INTRAVENOUS | Status: AC
  Filled 2022-12-06: qty 100

## 2022-12-06 MED ORDER — DEXAMETHASONE SODIUM PHOSPHATE 4 MG/ML IJ SOLN
INTRAMUSCULAR | Status: DC | PRN
  Administered 2022-12-06: 5 mg via INTRAVENOUS

## 2022-12-06 MED ORDER — ACETAMINOPHEN 500 MG PO TABS
ORAL_TABLET | ORAL | Status: AC
  Filled 2022-12-06: qty 2

## 2022-12-06 MED ORDER — LIDOCAINE HCL (CARDIAC) PF 100 MG/5ML IV SOSY
PREFILLED_SYRINGE | INTRAVENOUS | Status: DC | PRN
  Administered 2022-12-06: 50 mg via INTRAVENOUS

## 2022-12-06 MED ORDER — EPHEDRINE 5 MG/ML INJ
INTRAVENOUS | Status: AC
  Filled 2022-12-06: qty 5

## 2022-12-06 MED ORDER — PROPOFOL 10 MG/ML IV BOLUS
INTRAVENOUS | Status: DC | PRN
Start: 2022-12-06 — End: 2022-12-06
  Administered 2022-12-06: 150 mg via INTRAVENOUS

## 2022-12-06 MED ORDER — OXYCODONE HCL 5 MG/5ML PO SOLN: 5.0000 mg | Freq: Once | ORAL | Status: AC | PRN

## 2022-12-06 MED ORDER — LACTATED RINGERS IV SOLN: INTRAVENOUS | Status: DC

## 2022-12-06 MED ORDER — PROMETHAZINE HCL 25 MG/ML IJ SOLN: 6.2500 mg | INTRAMUSCULAR | Status: DC | PRN

## 2022-12-06 MED ORDER — MAGTRACE LYMPHATIC TRACER
INTRAMUSCULAR | Status: DC | PRN
  Administered 2022-12-06: 1.5 mL via INTRAMUSCULAR

## 2022-12-06 SURGICAL SUPPLY — 56 items
ADH SKN CLS APL DERMABOND .7 (GAUZE/BANDAGES/DRESSINGS) ×1
APL PRP STRL LF DISP 70% ISPRP (MISCELLANEOUS) ×1
APPLIER CLIP 9.375 MED OPEN (MISCELLANEOUS) ×1
APR CLP MED 9.3 20 MLT OPN (MISCELLANEOUS) ×1
BINDER BREAST LRG (GAUZE/BANDAGES/DRESSINGS) IMPLANT
BINDER BREAST MEDIUM (GAUZE/BANDAGES/DRESSINGS) IMPLANT
BINDER BREAST XLRG (GAUZE/BANDAGES/DRESSINGS) IMPLANT
BINDER BREAST XXLRG (GAUZE/BANDAGES/DRESSINGS) IMPLANT
BLADE SURG 15 STRL LF DISP TIS (BLADE) ×1 IMPLANT
BLADE SURG 15 STRL SS (BLADE) ×1
CANISTER SUC SOCK COL 7IN (MISCELLANEOUS) IMPLANT
CANISTER SUCT 1200ML W/VALVE (MISCELLANEOUS) IMPLANT
CHLORAPREP W/TINT 26 (MISCELLANEOUS) ×1 IMPLANT
CLIP APPLIE 9.375 MED OPEN (MISCELLANEOUS) IMPLANT
CLIP TI WIDE RED SMALL 6 (CLIP) ×1 IMPLANT
COVER BACK TABLE 60X90IN (DRAPES) ×1 IMPLANT
COVER MAYO STAND STRL (DRAPES) ×1 IMPLANT
COVER PROBE CYLINDRICAL 5X96 (MISCELLANEOUS) ×1 IMPLANT
DERMABOND ADVANCED .7 DNX12 (GAUZE/BANDAGES/DRESSINGS) ×1 IMPLANT
DRAPE LAPAROSCOPIC ABDOMINAL (DRAPES) ×1 IMPLANT
DRAPE UTILITY XL STRL (DRAPES) ×1 IMPLANT
ELECT COATED BLADE 2.86 ST (ELECTRODE) ×1 IMPLANT
ELECT REM PT RETURN 9FT ADLT (ELECTROSURGICAL) ×1
ELECTRODE REM PT RTRN 9FT ADLT (ELECTROSURGICAL) ×1 IMPLANT
GLOVE BIO SURGEON STRL SZ7 (GLOVE) ×2 IMPLANT
GLOVE BIOGEL PI IND STRL 7.5 (GLOVE) ×1 IMPLANT
GOWN STRL REUS W/ TWL LRG LVL3 (GOWN DISPOSABLE) ×2 IMPLANT
GOWN STRL REUS W/TWL LRG LVL3 (GOWN DISPOSABLE) ×2
HEMOSTAT ARISTA ABSORB 3G PWDR (HEMOSTASIS) IMPLANT
KIT MARKER MARGIN INK (KITS) ×1 IMPLANT
NDL HYPO 25X1 1.5 SAFETY (NEEDLE) ×1 IMPLANT
NDL SAFETY ECLIP 18X1.5 (MISCELLANEOUS) IMPLANT
NEEDLE HYPO 25X1 1.5 SAFETY (NEEDLE) ×1
NS IRRIG 1000ML POUR BTL (IV SOLUTION) IMPLANT
PACK BASIN DAY SURGERY FS (CUSTOM PROCEDURE TRAY) ×1 IMPLANT
PENCIL SMOKE EVACUATOR (MISCELLANEOUS) ×1 IMPLANT
RETRACTOR ONETRAX LX 90X20 (MISCELLANEOUS) IMPLANT
SLEEVE SCD COMPRESS KNEE MED (STOCKING) ×1 IMPLANT
SPIKE FLUID TRANSFER (MISCELLANEOUS) IMPLANT
SPONGE T-LAP 4X18 ~~LOC~~+RFID (SPONGE) ×1 IMPLANT
STRIP CLOSURE SKIN 1/2X4 (GAUZE/BANDAGES/DRESSINGS) ×1 IMPLANT
SUT ETHILON 2 0 FS 18 (SUTURE) IMPLANT
SUT MNCRL AB 4-0 PS2 18 (SUTURE) ×1 IMPLANT
SUT MON AB 5-0 PS2 18 (SUTURE) IMPLANT
SUT SILK 2 0 SH (SUTURE) IMPLANT
SUT VIC AB 2-0 SH 27 (SUTURE) ×2
SUT VIC AB 2-0 SH 27XBRD (SUTURE) ×1 IMPLANT
SUT VIC AB 3-0 SH 27 (SUTURE) ×1
SUT VIC AB 3-0 SH 27X BRD (SUTURE) ×1 IMPLANT
SUT VIC AB 5-0 PS2 18 (SUTURE) IMPLANT
SYR CONTROL 10ML LL (SYRINGE) ×1 IMPLANT
TOWEL GREEN STERILE FF (TOWEL DISPOSABLE) ×1 IMPLANT
TRACER MAGTRACE VIAL (MISCELLANEOUS) IMPLANT
TRAY FAXITRON CT DISP (TRAY / TRAY PROCEDURE) ×1 IMPLANT
TUBE CONNECTING 20X1/4 (TUBING) IMPLANT
YANKAUER SUCT BULB TIP NO VENT (SUCTIONS) IMPLANT

## 2022-12-06 NOTE — Progress Notes (Signed)
Assisted Dr. Ellender with left, pectoralis, ultrasound guided block. Side rails up, monitors on throughout procedure. See vital signs in flow sheet. Tolerated Procedure well. 

## 2022-12-06 NOTE — Op Note (Signed)
Preoperative diagnosis: Stage I left breast cancer Postoperative diagnosis: Same as above Procedure: 1.  Left breast radioactive seed guided lumpectomy 2.  Injection of mag trace for sentinel lymph node identification 3.  Left deep axillary sentinel lymph node biopsy Surgeon: Dr. Harden Mo Anesthesia: General with a pectoral block Estimated blood loss: Minimal Complications: None Drains: None Specimens: 1.  Left breast radioactive seed guided lumpectomy with specimen mammogram containing seed and clip 2.  Additional anterior, lateral, inferior margins marked short stitch superior, long stitch lateral, double stitch deep 3.  Left deep axillary sentinel lymph nodes with highest count of 8972 Sponge needle count was correct completion Disposition recovery stable condition  Indications:61 yof with screening mm that shows a mass. She has c density breast tissue. She has distortion in the upper breast. There is a mass by [place] that measures 1.2x0.7x0.6 cm. Axilla is negative. Biopsy is grade I IDC with dcis that is 100T er pos, 85% pr pos, her 2 negative, Ki is 10% we agreed to perform a lumpectomy and sentinel lymph node biopsy.  Procedure: After informed consent was obtained she had already had a seed placed at the breast center.  I had these mammograms in the operating room.  She underwent a pectoral block with anesthesia.  She was given antibiotics.  SCDs were placed.  She then was placed under general anesthesia without complication.  She was prepped and draped in the standard sterile surgical fashion.  Surgical timeout was then performed.  I first injected 1.5 cc of mag trace in the subareolar position and massaged this for 5 minutes.  I identified the seed in the upper outer portion of the breast.  I elected to make 1 incision in the very upper outer portion of the breast in the low axilla.  I did both procedures through 1 incision.  I dissected to the seed.  I move the radioactive seed in  the surrounding tissue with an attempt to get a clear margin.  Mammogram confirmed removal of the seed and the clip.  The posterior margin is the muscle.  I thought I might be close to several margins so I remove these as above.  I then entered into the axilla.  I used the Sentimag probe to identify what were several lymph nodes that had activity.  I removed these as a bundle.  There was no remaining activity in the axilla.  There is no palpable nodes present.  I obtained hemostasis in the axilla.  I closed the axilla with 2-0 Vicryl suture.  I then obtained hemostasis in the lumpectomy cavity.  I closed this down with 2-0 Vicryl suture so that was in the defect.  I then closed the skin with 3-0 Vicryl and 4-0  Monocryl.  Glue and Steri-Strips were applied.  She tolerated this well was extubated transferred to recovery stable.

## 2022-12-06 NOTE — Transfer of Care (Signed)
Immediate Anesthesia Transfer of Care Note  Patient: Bethany Mills  Procedure(s) Performed: LEFT BREAST SEED GUIDED LUMPECTOMY, LEFT AXILLARY SENTINEL NODE BIOPSY (Left: Breast)  Patient Location: PACU  Anesthesia Type:GA combined with regional for post-op pain  Level of Consciousness: sedated  Airway & Oxygen Therapy: Patient Spontanous Breathing and Patient connected to face mask oxygen  Post-op Assessment: Report given to RN and Post -op Vital signs reviewed and stable  Post vital signs: Reviewed and stable  Last Vitals:  Vitals Value Taken Time  BP    Temp    Pulse    Resp    SpO2      Last Pain:  Vitals:   12/06/22 0657  TempSrc: Oral  PainSc: 0-No pain         Complications: No notable events documented.

## 2022-12-06 NOTE — Anesthesia Procedure Notes (Signed)
Procedure Name: LMA Insertion Date/Time: 12/06/2022 8:49 AM  Performed by: Ronnette Hila, CRNAPre-anesthesia Checklist: Patient identified, Emergency Drugs available, Suction available and Patient being monitored Patient Re-evaluated:Patient Re-evaluated prior to induction Oxygen Delivery Method: Circle System Utilized Preoxygenation: Pre-oxygenation with 100% oxygen Induction Type: IV induction Ventilation: Mask ventilation without difficulty LMA: LMA inserted LMA Size: 3.0 Number of attempts: 1 Airway Equipment and Method: bite block Placement Confirmation: positive ETCO2 Tube secured with: Tape Dental Injury: Teeth and Oropharynx as per pre-operative assessment

## 2022-12-06 NOTE — Discharge Instructions (Addendum)
TOOK 1000mg  TYLENOL at 7:10 AM   Post Anesthesia Home Care Instructions  Activity: Get plenty of rest for the remainder of the day. A responsible individual must stay with you for 24 hours following the procedure.  For the next 24 hours, DO NOT: -Drive a car -Advertising copywriter -Drink alcoholic beverages -Take any medication unless instructed by your physician -Make any legal decisions or sign important papers.  Meals: Start with liquid foods such as gelatin or soup. Progress to regular foods as tolerated. Avoid greasy, spicy, heavy foods. If nausea and/or vomiting occur, drink only clear liquids until the nausea and/or vomiting subsides. Call your physician if vomiting continues.  Special Instructions/Symptoms: Your throat may feel dry or sore from the anesthesia or the breathing tube placed in your throat during surgery. If this causes discomfort, gargle with warm salt water. The discomfort should disappear within 24 hours.  If you had a scopolamine patch placed behind your ear for the management of post- operative nausea and/or vomiting:  1. The medication in the patch is effective for 72 hours, after which it should be removed.  Wrap patch in a tissue and discard in the trash. Wash hands thoroughly with soap and water. 2. You may remove the patch earlier than 72 hours if you experience unpleasant side effects which may include dry mouth, dizziness or visual disturbances. 3. Avoid touching the patch. Wash your hands with soap and water after contact with the patch.        [facility] Office Phone Number [phone]  POST OP INSTRUCTIONS Take 400 mg of ibuprofen every 8 hours or 650 mg tylenol every 6 hours for next 72 hours then as needed. Use ice several times daily also.  A prescription for pain medication may be given to you upon discharge.  Take your pain medication as prescribed, if needed.  If narcotic pain medicine is not needed, then you may take  acetaminophen (Tylenol), naprosyn (Alleve) or ibuprofen (Advil) as needed. Take your usually prescribed medications unless otherwise directed If you need a refill on your pain medication, please contact your pharmacy.  They will contact our office to request authorization.  Prescriptions will not be filled after 5pm or on week-ends. You should eat very light the first 24 hours after surgery, such as soup, crackers, pudding, etc.  Resume your normal diet the day after surgery. Most patients will experience some swelling and bruising in the breast.  Ice packs and a good support bra will help.  Wear the breast binder provided or a sports bra for 72 hours day and night.  After that wear a sports bra during the day until you return to the office. Swelling and bruising can take several days to resolve.  It is common to experience some constipation if taking pain medication after surgery.  Increasing fluid intake and taking a stool softener will usually help or prevent this problem from occurring.  A mild laxative (Milk of Magnesia or Miralax) should be taken according to package directions if there are no bowel movements after 48 hours. I used skin glue on the incision, you may shower in 24 hours.  The glue will flake off over the next 2-3 weeks.  Any sutures or staples will be removed at the office during your follow-up visit. ACTIVITIES:  You may resume regular daily activities (gradually increasing) beginning the next day.  Wearing a good support bra or sports bra minimizes pain and swelling.  You may have sexual intercourse when it  is comfortable. You may drive when you no longer are taking prescription pain medication, you can comfortably wear a seatbelt, and you can safely maneuver your car and apply brakes. RETURN TO WORK:  ______________________________________________________________________________________ Bethany Mills should see your doctor in the office for a follow-up appointment approximately two weeks  after your surgery.  Your doctor's nurse will typically make your follow-up appointment when she calls you with your pathology report.  Expect your pathology report 3-4 business days after your surgery.  You may call to check if you do not hear from [place] after three days. OTHER INSTRUCTIONS: _______________________________________________________________________________________________ _____________________________________________________________________________________________________________________________________ _____________________________________________________________________________________________________________________________________ _____________________________________________________________________________________________________________________________________  WHEN TO CALL DR WAKEFIELD: Fever over 101.0 Nausea and/or vomiting. Extreme swelling or bruising. Continued bleeding from incision. Increased pain, redness, or drainage from the incision.  The clinic staff is available to answer your questions during regular business hours.  Please don't hesitate to call and ask to speak to one of the nurses for clinical concerns.  If you have a medical emergency, go to the nearest emergency room or call 911.  A surgeon from [facility] is always on call at the hospital.  For further questions, please visit [email or web address]

## 2022-12-06 NOTE — Anesthesia Postprocedure Evaluation (Signed)
Anesthesia Post Note  Patient: Bethany Mills  Procedure(s) Performed: LEFT BREAST SEED GUIDED LUMPECTOMY, LEFT AXILLARY SENTINEL NODE BIOPSY (Left: Breast)     Patient location during evaluation: PACU Anesthesia Type: Regional and General Level of consciousness: awake Pain management: pain level controlled Vital Signs Assessment: post-procedure vital signs reviewed and stable Respiratory status: spontaneous breathing, nonlabored ventilation and respiratory function stable Cardiovascular status: blood pressure returned to baseline and stable Postop Assessment: no apparent nausea or vomiting Anesthetic complications: no   No notable events documented.  Last Vitals:  Vitals:   12/06/22 1130 12/06/22 1150  BP: (!) 157/77 131/74  Pulse: 73 74  Resp: 13 16  Temp:  36.7 C  SpO2: 92% 93%    Last Pain:  Vitals:   12/06/22 1153  TempSrc:   PainSc: 5                  Eliyah Mcshea P Ander Wamser

## 2022-12-06 NOTE — Anesthesia Preprocedure Evaluation (Addendum)
Anesthesia Evaluation  Patient identified by MRN, date of birth, ID band Patient awake    Reviewed: Allergy & Precautions, NPO status , Patient's Chart, lab work & pertinent test results  Airway Mallampati: II  TM Distance: >3 FB Neck ROM: Full    Dental  (+) Upper Dentures   Pulmonary COPD, Current SmokerPatient did not abstain from smoking.   Pulmonary exam normal        Cardiovascular hypertension, Pt. on medications Normal cardiovascular exam     Neuro/Psych  Headaches  Anxiety        GI/Hepatic negative GI ROS,,,(+)     substance abuse    Endo/Other  negative endocrine ROS    Renal/GU negative Renal ROS     Musculoskeletal  (+) Arthritis ,    Abdominal   Peds  Hematology negative hematology ROS (+)   Anesthesia Other Findings LEFT BREAST CANCER  Reproductive/Obstetrics                             Anesthesia Physical Anesthesia Plan  ASA: 3  Anesthesia Plan: General and Regional   Post-op Pain Management: Regional block*   Induction: Intravenous  PONV Risk Score and Plan: 2 and Ondansetron, Dexamethasone, Propofol infusion, Midazolam and Treatment may vary due to age or medical condition  Airway Management Planned: LMA  Additional Equipment:   Intra-op Plan:   Post-operative Plan: Extubation in OR  Informed Consent: I have reviewed the patients History and Physical, chart, labs and discussed the procedure including the risks, benefits and alternatives for the proposed anesthesia with the patient or authorized representative who has indicated his/her understanding and acceptance.     Dental advisory given  Plan Discussed with: CRNA  Anesthesia Plan Comments:        Anesthesia Quick Evaluation

## 2022-12-06 NOTE — Anesthesia Procedure Notes (Signed)
Anesthesia Regional Block: Pectoralis block   Pre-Anesthetic Checklist: , timeout performed,  Correct Patient, Correct Site, Correct Laterality,  Correct Procedure, Correct Position, site marked,  Risks and benefits discussed,  Surgical consent,  Pre-op evaluation,  At surgeon's request and post-op pain management  Laterality: Left  Prep: chloraprep       Needles:  Injection technique: Single-shot  Needle Type: Echogenic Stimulator Needle     Needle Length: 10cm  Needle Gauge: 20     Additional Needles:   Procedures:,,,, ultrasound used (permanent image in chart),,    Narrative:  Start time: 12/06/2022 8:20 AM End time: 12/06/2022 8:30 AM Injection made incrementally with aspirations every 5 mL.  Performed by: Personally  Anesthesiologist: Leonides Grills, MD  Additional Notes: Functioning IV was confirmed and monitors were applied.  A timeout was performed. Sterile prep, hand hygiene and sterile gloves were used. A 20ga Bbraun echogenic stimulator needle was used. Negative aspiration and negative test dose prior to incremental administration of local anesthetic. The patient tolerated the procedure well.  Ultrasound guidance: relevent anatomy identified, needle position confirmed, local anesthetic spread visualized around nerve(s), vascular puncture avoided.  Image printed for medical record.

## 2022-12-06 NOTE — Interval H&P Note (Signed)
History and Physical Interval Note:  12/06/2022 8:31 AM  [name]  has presented today for surgery, with the diagnosis of LEFT BREAST CANCER.  The various methods of treatment have been discussed with the patient and family. After consideration of risks, benefits and other options for treatment, the patient has consented to  Procedure(s) with comments: LEFT BREAST SEED GUIDED LUMPECTOMY, LEFT AXILLARY SENTINEL NODE BIOPSY (Left) - PEC BLOCK as a surgical intervention.  The patient's history has been reviewed, patient examined, no change in status, stable for surgery.  I have reviewed the patient's chart and labs.  Questions were answered to the patient's satisfaction.     Bethany Mills

## 2022-12-06 NOTE — H&P (Signed)
40 yof with screening mm that shows a mass. She has c density breast tissue. She has distortion in the upper breast. There is a mass by [place] that measures 1.2x0.7x0.6 cm. Axilla is negative. Biopsy is grade I IDC with dcis that is 100T er pos, 85% pr pos, her 2 negative, Ki is 10%. She is here to discuss options.   Review of Systems: A complete review of systems was obtained from the patient. I have reviewed this information and discussed as appropriate with the patient. See HPI as well for other ROS.  Review of Systems  All other systems reviewed and are negative.   Medical History: Past Medical History:  Diagnosis Date  Anxiety  History of cancer  History of stroke  Hyperlipidemia    Past Surgical History:  Procedure Laterality Date  HYSTERECTOMY    Allergies  Allergen Reactions  Codeine Nausea and Nausea And Vomiting  Neomycin-Polymyxin B Gu Other (See Comments)  Causes eyes to swell, run and crust over.  Neomycin-Bacitracin-Polymyxin Hives and Rash   Current Outpatient Medications on File Prior to Visit  Medication Sig Dispense Refill  acetaminophen (TYLENOL) 500 MG tablet Take by mouth  ALPRAZolam (XANAX) 0.25 MG tablet Take 0.25 mg by mouth 2 (two) times daily as needed for Anxiety  amLODIPine (NORVASC) 10 MG tablet Take 1 tablet by mouth once daily  aspirin 81 MG chewable tablet Take 81 mg by mouth once daily  busPIRone (BUSPAR) 10 MG tablet  ibuprofen (MOTRIN) 200 MG tablet Take 400 mg by mouth every 6 (six) hours as needed  rosuvastatin (CRESTOR) 20 MG tablet Take 20 mg by mouth once daily    Family History  Problem Relation Age of Onset  High blood pressure (Hypertension) Mother  Hyperlipidemia (Elevated cholesterol) Mother  Breast cancer Mother  Myocardial Infarction (Heart attack) Mother  Myocardial Infarction (Heart attack) Father  High blood pressure (Hypertension) Father  Hyperlipidemia (Elevated cholesterol) Brother    Social History   Tobacco Use   Smoking Status Every Day  Types: Cigarettes  Smokeless Tobacco Never  Marital status: Divorced  Tobacco Use  Smoking status: Every Day  Types: Cigarettes  Smokeless tobacco: Never   Objective:    Physical Exam Vitals reviewed.  Constitutional:  Appearance: Normal appearance.  Chest:  Breasts: Right: No mass, nipple discharge or skin change.  Left: No mass, nipple discharge or skin change.  Lymphadenopathy:  Upper Body:  Right upper body: No supraclavicular or axillary adenopathy.  Left upper body: No supraclavicular or axillary adenopathy.  Neurological:  Mental Status: She is alert.    Assessment and Plan:   Stage I left breast cancer  Left breast seed guided lumpectomy, left axillary sentinel node biopsy  We discussed the staging and pathophysiology of breast cancer. We discussed all of the different options for treatment for breast cancer including surgery, chemotherapy, radiation therapy, Herceptin, and antiestrogen therapy.  We discussed a sentinel lymph node biopsy as she does not appear to having lymph node involvement right now. We discussed the performance of that with injection of magtrace. We discussed that there is a chance of having a positive node with a sentinel lymph node biopsy and we will await the permanent pathology to make any other first further decisions in terms of her treatment. We discussed up to a 5% risk lifetime of chronic shoulder pain as well as lymphedema associated with a sentinel lymph node biopsy.  We discussed the options for treatment of the breast cancer which included lumpectomy versus  a mastectomy. We discussed the performance of the lumpectomy with radioactive seed placement. We discussed a 5-10% chance of a positive margin requiring reexcision in the operating room. We also discussed that she will likely need radiation therapy if she undergoes lumpectomy. We discussed mastectomy and the postoperative care for that as well. Mastectomy  can be followed by reconstruction. The decision for lumpectomy vs mastectomy has no impact on decision for chemotherapy. Most mastectomy patients will not need radiation therapy. We discussed that there is no difference in her survival whether she undergoes lumpectomy with radiation therapy or antiestrogen therapy versus a mastectomy. There is also no real difference between her recurrence in the breast.  We discussed the risks of operation including bleeding, infection, possible reoperation. She understands her further therapy will be based on what her stages at the time of her operation.

## 2022-12-07 ENCOUNTER — Encounter (HOSPITAL_BASED_OUTPATIENT_CLINIC_OR_DEPARTMENT_OTHER): Payer: Self-pay | Admitting: General Surgery

## 2022-12-12 ENCOUNTER — Encounter: Payer: Self-pay | Admitting: *Deleted

## 2022-12-14 ENCOUNTER — Encounter: Payer: Self-pay | Admitting: *Deleted

## 2022-12-19 NOTE — Progress Notes (Signed)
Patient Care Team: Park Meo, FNP as PCP - General (Family Medicine) Pershing Proud, RN as Oncology Nurse Navigator Donnelly Angelica, RN as Oncology Nurse Navigator Emelia Loron, MD as Consulting Physician (General Surgery) Lonie Peak, MD as Attending Physician (Radiation Oncology) Serena Croissant, MD as Consulting Physician (Hematology and Oncology)  DIAGNOSIS: No diagnosis found.  SUMMARY OF ONCOLOGIC HISTORY: Oncology History  Malignant neoplasm of upper-outer quadrant of left breast in female, estrogen receptor positive (HCC)  11/13/2022 Initial Diagnosis   Screening mammogram detected left breast mass and distortion at 12 o'clock position measuring 1.2 cm, axilla negative, biopsy revealed grade 1 IDC with DCIS ER 100%, PR 85%, Ki67 10%, HER2 negative 1+   11/22/2022 Cancer Staging   Staging form: Breast, AJCC 8th Edition - Clinical stage from 11/22/2022: Stage IA (cT1c, cN0, cM0, G1, ER+, PR+, HER2-) - Signed by Serena Croissant, MD on 11/22/2022 Stage prefix: Initial diagnosis Histologic grading system: 3 grade system Laterality: Left Staged by: Pathologist and managing physician National guidelines used in treatment planning: Yes Type of national guideline used in treatment planning: NCCN     CHIEF COMPLIANT:   INTERVAL HISTORY: Bethany Mills is a   ALLERGIES:  is allergic to codeine, neomycin-polymyxin b gu, and neomycin-bacitracin zn-polymyx.  MEDICATIONS:  Current Outpatient Medications  Medication Sig Dispense Refill   ALPRAZolam (XANAX) 0.5 MG tablet Take 1-2 tablets (0.5-1 mg total) by mouth 3 (three) times daily as needed for anxiety. 90 tablet 0   amLODipine (NORVASC) 10 MG tablet Take 1 tablet (10 mg total) by mouth daily. 30 tablet 3   aspirin 81 MG chewable tablet Chew 1 tablet (81 mg total) by mouth daily. 30 tablet 0   busPIRone (BUSPAR) 5 MG tablet Take 5 mg by mouth 2 (two) times daily.     clotrimazole-betamethasone (LOTRISONE) cream Apply  1 Application topically daily. 30 g 0   oxyCODONE (OXY IR/ROXICODONE) 5 MG immediate release tablet Take 1 tablet (5 mg total) by mouth every 6 (six) hours as needed. 10 tablet 0   rosuvastatin (CRESTOR) 20 MG tablet Take 1 tablet (20 mg total) by mouth daily. 90 tablet 3   No current facility-administered medications for this visit.    PHYSICAL EXAMINATION: ECOG PERFORMANCE STATUS: {CHL ONC ECOG [phone]}  There were no vitals filed for this visit. There were no vitals filed for this visit.  BREAST:*** No palpable masses or nodules in either right or left breasts. No palpable axillary supraclavicular or infraclavicular adenopathy no breast tenderness or nipple discharge. (exam performed in the presence of a chaperone)  LABORATORY DATA:  I have reviewed the data as listed    Latest Ref Rng & Units 11/22/2022   12:21 PM 11/06/2022    8:53 AM 10/04/2022    8:13 AM  CMP  Glucose 70 - 99 mg/dL 161  096  045   BUN 8 - 23 mg/dL 9  14  12    Creatinine 0.44 - 1.00 mg/dL 4.09  8.11  9.14   Sodium 135 - 145 mmol/L 140  143  141   Potassium 3.5 - 5.1 mmol/L 3.4  4.9  4.2   Chloride 98 - 111 mmol/L 103  105  102   CO2 22 - 32 mmol/L 25  30  28    Calcium 8.9 - 10.3 mg/dL 9.8  9.6  9.6   Total Protein 6.5 - 8.1 g/dL 8.1  7.5  7.4   Total Bilirubin 0.3 - 1.2 mg/dL 0.4  0.2  0.4   Alkaline Phos 38 - 126 U/L 114     AST 15 - 41 U/L 26  18  23    ALT 0 - 44 U/L 29  24  37     Lab Results  Component Value Date   WBC 8.7 11/22/2022   HGB 13.2 11/22/2022   HCT 38.9 11/22/2022   MCV 87.4 11/22/2022   PLT 304 11/22/2022   NEUTROABS 5.5 11/22/2022    ASSESSMENT & PLAN:  No problem-specific Assessment & Plan notes found for this encounter.    No orders of the defined types were placed in this encounter.  The patient has a good understanding of the overall plan. she agrees with it. she will call with any problems that may develop before the next visit here. Total time spent: 30 mins  including face to face time and time spent for planning, charting and co-ordination of care   Sherlyn Lick, CMA 12/19/22    I Janan Ridge am acting as a Neurosurgeon for [employer]  ***

## 2022-12-21 ENCOUNTER — Inpatient Hospital Stay: Payer: MEDICAID | Attending: Hematology and Oncology | Admitting: Hematology and Oncology

## 2022-12-21 ENCOUNTER — Other Ambulatory Visit: Payer: Self-pay

## 2022-12-21 VITALS — BP 139/109 | HR 85 | Temp 97.3°F | Resp 18 | Ht 63.0 in | Wt 144.5 lb

## 2022-12-21 DIAGNOSIS — Z79899 Other long term (current) drug therapy: Secondary | ICD-10-CM | POA: Diagnosis not present

## 2022-12-21 DIAGNOSIS — C773 Secondary and unspecified malignant neoplasm of axilla and upper limb lymph nodes: Secondary | ICD-10-CM | POA: Insufficient documentation

## 2022-12-21 DIAGNOSIS — Z170 Estrogen receptor positive status [ER+]: Secondary | ICD-10-CM | POA: Diagnosis not present

## 2022-12-21 DIAGNOSIS — C50412 Malignant neoplasm of upper-outer quadrant of left female breast: Secondary | ICD-10-CM | POA: Insufficient documentation

## 2022-12-21 NOTE — Assessment & Plan Note (Signed)
12/06/2022: Left lumpectomy: Grade 2 IDC 1.1 cm, margins negative, 1/1 lymph node positive with extranodal extension ER 100%, PR 85%, HER2 1+ negative, Ki-67 10%  Pathology counseling: I discussed the final pathology report of the patient provided  a copy of this report. I discussed the margins as well as lymph node surgeries. We also discussed the final staging along with previously performed ER/PR and HER-2/neu testing.  Treatment plan: Will not performing Oncotype DX testing because patient does not have any interest in chemo. Adjuvant radiation Adjuvant antiestrogen therapy with anastrozole 1 mg daily x 5 years  Return to clinic after radiation is complete

## 2022-12-22 ENCOUNTER — Other Ambulatory Visit: Payer: Self-pay | Admitting: Family Medicine

## 2022-12-22 NOTE — Progress Notes (Signed)
Location of Breast Cancer: Malignant neoplasm of upper-outer quadrant of left breast in female, estrogen receptor positive   Histology per Pathology Report:  12-06-22 FINAL MICROSCOPIC DIAGNOSIS:  A. BREAST, LEFT, LUMPECTOMY:      Invasive ductal carcinoma, 1.1 cm, grade 2 Ductal carcinoma in situ:  Not identified Margins, invasive:  Lateral margin is positive for carcinoma (See part F for final margin)                  0.2 cm from the inferior margin      Closest, invasive:  Inferior margin Margins, LCIS:  Negative      Closest, LCIS:  0.6 mm from the anterior margin Lymphovascular invasion:  Identified (See part B) Prognostic markers: ER: 100%, Positive, strong staining intensity PR: 85%, Positive, moderate-strong staining intensity Her2: Negative, 1+ Ki-67: 10% Other:  Lobular carcinoma in situ: Identified, 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 LYMPH NODE, LEFT AXILLARY, SENTINEL, EXCISION:      One lymph node, positive for metastatic carcinoma (1/1).      Size of metastatic focus: 1.2 cm in maximal dimension.      Extranodal extension: Identified.           Size of extranodal extension: 0.1 cm in maximal dimension.      Lymphovascular invasion identified.      See comment.  C. LYMPH NODE, LEFT AXILLARY, SENTINEL, EXCISION:      Benign breast tissue with fibrocystic changes including stromal fibrosis, usual ductal hyperplasia and microcysts.      Negative for atypia or malignancy.      No lymphoid tissue identified.  D. LYMPH NODE, LEFT AXILLARY, SENTINEL, EXCISION:      Benign fibroadenoma with calcifications.      Negative for atypia or malignancy.      No lymphoid tissue identified.  E. BREAST, LEFT INFERIOR MARGIN, EXCISION:      Benign breast tissue with florid usual ductal hyperplasia, sclerosing adenosis and microcysts.      Negative for atypia or malignancy.  F. BREAST, LEFT LATERAL MARGIN,  EXCISION:      Invasive ductal carcinoma.      Ture lateral margin is negative for carcinoma.           Carcinoma is 0.5 cm from the true lateral margin.  G. BREAST, LEFT ANTERIOR MARGIN, EXCISION:      Benign breast tissue with focal usual ductal hyperplasia.      Negative for atypia or malignancy.  ONCOLOGY TABLE:  INVASIVE CARCINOMA OF THE BREAST:  Resection  Procedure: Lumpectomy Specimen Laterality: Left Histologic Type: Invasive ductal carcinoma Histologic Grade:      Glandular (Acinar)/Tubular Differentiation: 3      Nuclear Pleomorphism: 2      Mitotic Rate: 1      Overall Grade: 2 Tumor Size: 1.1 cm (microscopically) Ductal Carcinoma In Situ: Not identified. Lymphatic and/or Vascular Invasion:  Identified Treatment Effect in the Breast: No known presurgical therapy Margins: All margins negative for invasive carcinoma      Distance from Closest Margin (mm): 2      Specify Closest Margin (required only if <44mm): Inferior margin DCIS Margins: NA      Distance from Closest Margin (mm): NA      Specify Closest Margin (required only if <45mm): NA Regional Lymph Nodes:      Number of Lymph Nodes Examined: 1      Number of Sentinel Nodes  Examined: 1      Number of Lymph Nodes with Macrometastases (>2 mm): 1      Number of Lymph Nodes with Micrometastases: 0      Number of Lymph Nodes with Isolated Tumor Cells (=0.2 mm or =200 cells): 0      Size of Largest Metastatic Deposit (mm): 1.2 cm      Extranodal Extension: Identified Distant Metastasis:      Distant Site(s) Involved: Not applicable Breast Biomarker Testing Performed on Previous Biopsy:  Yes      Testing Performed on Case Number: [id number]            Estrogen Receptor: 100%, Positive, strong staining intensity            Progesterone Receptor: 85%, Positive, moderate to strong staining intensity            HER2: Negative, 1+            Ki-67: 10% Pathologic Stage Classification (pTNM, AJCC 8th Edition):  pT1c, pN1a Representative Tumor Block: A2, A4, F2 Other: Lobular Carcinoma In Situ: 0.7 cm, nuclear grade 2            LCIS Margins: Negative           Distance from Closest Margin (mm): 0.6 mm from anterior margin                Specify Closest Margin (required only if <8mm): Anterior margin Comment(s): See comment ([id number])   Receptor Status: ER(100%), PR (85%), Her2-neu (neg), Ki-67(10%)  Did patient present with symptoms (if so, please note symptoms) or was this found on screening mammography?: screening mammogram  Past/Anticipated interventions by surgeon, if any: 12-06-22 Procedure: 1.  Left breast radioactive seed guided lumpectomy 2.  Injection of mag trace for sentinel lymph node identification 3.  Left deep axillary sentinel lymph node biopsy Surgeon: Dr. Harden Mo  Past/Anticipated interventions by medical oncology, if any:  Serena Croissant, MD 11/22/2022  Oncology History  Malignant neoplasm of upper-outer quadrant of left breast in female, estrogen receptor positive (HCC)  11/13/2022 Initial Diagnosis    Screening mammogram detected left breast mass and distortion at 12 o'clock position measuring 1.2 cm, axilla negative, biopsy revealed grade 1 IDC with DCIS ER 100%, PR 85%, Ki67 10%, HER2 negative 1+  ASSESSMENT AND PLAN:  Malignant neoplasm of upper-outer quadrant of left breast in female, estrogen receptor positive (HCC) 11/13/2022:Screening mammogram detected left breast mass and distortion at 12 o'clock position measuring 1.2 cm, axilla negative, biopsy revealed grade 1 IDC with DCIS ER 100%, PR 85%, Ki67 10%, HER2 negative 1+   Pathology and radiology counseling:Discussed with the patient, the details of pathology including the type of breast cancer,the clinical staging, the significance of ER, PR and HER-2/neu receptors and the implications for treatment. After reviewing the pathology in detail, we proceeded to discuss the different treatment options between  surgery, radiation, chemotherapy, antiestrogen therapies.   Recommendations: 1. Breast conserving surgery followed by 2. we are not doing Oncotype DX testing because the patient is not interested in systemic chemotherapy. 3. Adjuvant radiation therapy followed by 4. Adjuvant antiestrogen therapy    Lymphedema issues, if any:  none to report  Pain issues, if any: Pt does report that mild to moderate pain remains  in her left breast at times since surgery. She states it is not swollen though her surgical care does get swollen at times (surgery is aware of this and states it is normal). Pt states her  skin is healing better since she started using vitamin E to the area. She reports it has been red and irritated since surgery.   SAFETY ISSUES: Prior radiation? no Pacemaker/ICD? no Possible current pregnancy?no Is the patient on methotrexate? no  Current Complaints / other details:  Pt had no major questions or concerns. Pt stated her niece went through breast cancer radiation as well, and will be a great support for her.

## 2023-01-04 ENCOUNTER — Telehealth: Payer: Self-pay

## 2023-01-04 ENCOUNTER — Encounter: Payer: Self-pay | Admitting: Radiation Oncology

## 2023-01-04 NOTE — Progress Notes (Addendum)
Radiation Oncology         [phone] ________________________________  Name: Bethany Mills MRN: [id number]  Date: 01/05/2023  DOB: 1960-05-09  Follow-Up Visit Note  Outpatient  CC: Park Meo, FNP  Serena Croissant, MD  Diagnosis:      ICD-10-CM   1. Malignant neoplasm of upper-outer quadrant of left breast in female, estrogen receptor positive (HCC)  C50.412    Z17.0         Cancer Staging  Malignant neoplasm of upper-outer quadrant of left breast in female, estrogen receptor positive (HCC) Staging form: Breast, AJCC 8th Edition - Clinical stage from 11/22/2022: Stage IA (cT1c, cN0, cM0, G1, ER+, PR+, HER2-) - Signed by Serena Croissant, MD on 11/22/2022 pT1c, pN1a    CHIEF COMPLAINT: Here to discuss management of left breast cancer  Narrative:  The patient returns today for follow-up.     Since her breast clinic consultation date of 11/22/22, she opted to proceed with a left breast lumpectomy with left axillary SLN biopsies on 12/06/22 under the care of Dr. Dwain Sarna. Pathology from the procedure revealed: tumor size of 1.1 cm; histology of grade 2 invasive ductal carcinoma; all final margins negative for invasive carcinoma; margin status to invasive disease of 2 mm from the inferior margin; nodal status of 1/1 left axillary SLN excisions positive for carcinoma (with extranodal extension);  ER status: 100% positive with strong staining intensity; PR: status: 85% positive with moderate to strong staining intensity; Proliferation marker Ki67 at 10%; Her2 status negative; Grade 2.  Dr. Pamelia Hoit had offered Oncotype testing to the patient, however she is not interested in pursuing any form of chemotherapy at this time. In terms of antiestrogen therapy, she has opted to proceed with antiestrogen therapy consisting of anastrozole (1 mg daily x 5 years). She will return to Dr. Pamelia Hoit following XRT to initiate antiestrogen therapy.   Symptomatically, the patient reports: some itching  and redness at her lumpectomy site        ALLERGIES:  is allergic to codeine, neomycin-polymyxin b gu, and neomycin-bacitracin zn-polymyx.  Meds: Current Outpatient Medications  Medication Sig Dispense Refill   ALPRAZolam (XANAX) 0.5 MG tablet TAKE 1-2 TABLETS (0.5-1 MG TOTAL) BY MOUTH 3 (THREE) TIMES DAILY AS NEEDED FOR ANXIETY. 90 tablet 0   amLODipine (NORVASC) 10 MG tablet Take 1 tablet (10 mg total) by mouth daily. 30 tablet 3   aspirin 81 MG chewable tablet Chew 1 tablet (81 mg total) by mouth daily. 30 tablet 0   busPIRone (BUSPAR) 5 MG tablet Take 5 mg by mouth 2 (two) times daily.     clotrimazole-betamethasone (LOTRISONE) cream Apply 1 Application topically daily. 30 g 0   oxyCODONE (OXY IR/ROXICODONE) 5 MG immediate release tablet Take 1 tablet (5 mg total) by mouth every 6 (six) hours as needed. 10 tablet 0   rosuvastatin (CRESTOR) 20 MG tablet Take 1 tablet (20 mg total) by mouth daily. 90 tablet 3   No current facility-administered medications for this encounter.    Physical Findings:  01/05/2023  Vitals with BMI   Height 5' 3.5"   Weight 146 lbs 10 oz   BMI 25.56   Systolic 137   Diastolic 68   Pulse 77   Temp 60.6 F  General: Alert and oriented, in no acute distress Extremities: No cyanosis or edema. Good shoulder ROM Musculoskeletal: symmetric strength and muscle tone throughout. Neurologic: No obvious focalities. Speech is fluent.  Psychiatric: Judgment and insight are intact. Affect is appropriate.  Breast exam reveals erythema at lumpectomy /axillary scar, left breast. Photo sent to Dr Dwain Sarna  Lab Findings: Lab Results  Component Value Date   WBC 8.7 11/22/2022   HGB 13.2 11/22/2022   HCT 38.9 11/22/2022   MCV 87.4 11/22/2022   PLT 304 11/22/2022    @LASTCHEMISTRY @  Radiographic Findings: No results found.  Impression/Plan: Left breast cancer  We discussed adjuvant radiotherapy today.  I recommend 6 weeks of radiation to the left breast and  regional nodes in order to minimize risk of locoregional recurrence.  I reviewed the logistics, benefits, risks, and potential side effects of this treatment in detail. Risks may include but not necessary be limited to acute and late injury tissue in the radiation fields such as skin irritation (change in color/pigmentation, itching, dryness, pain, peeling). She may experience fatigue. We also discussed possible risk of long term cosmetic changes or scar tissue. There is also a smaller risk for lung toxicity, cardiac toxicity, brachial plexopathy, lymphedema, musculoskeletal changes, rib fragility or induction of a second malignancy, late chronic non-healing soft tissue wound.    The patient asked good questions which I answered to her satisfaction. She is enthusiastic about proceeding with treatment. A consent form has been  signed and placed in her chart. Proceed with RT planning today.  Will start RT in mid September.  I sent a photo of her peri-incision skin erythema to Dr Dwain Sarna, who will see her to evaluate.  She is not febrile or showing any signs of malaise.   On date of service, in total, I spent 40 minutes on this encounter. Patient was seen in person.  _____________________________________   Lonie Peak, MD  This document serves as a record of services personally performed by Lonie Peak, MD. It was created on her behalf by Neena Rhymes, a trained medical scribe. The creation of this record is based on the scribe's personal observations and the provider's statements to them. This document has been checked and approved by the attending provider.

## 2023-01-04 NOTE — Telephone Encounter (Signed)
 Rn called pt for meaningful use and nurse evaluation information. Consult note completed and routed to Dr. Basilio Cairo for review.

## 2023-01-05 ENCOUNTER — Ambulatory Visit
Admission: RE | Admit: 2023-01-05 | Discharge: 2023-01-05 | Disposition: A | Discharge status: Home / Self Care | Payer: MEDICAID | Source: Ambulatory Visit | Attending: Radiation Oncology | Admitting: Radiation Oncology

## 2023-01-05 ENCOUNTER — Encounter: Payer: Self-pay | Admitting: Radiation Oncology

## 2023-01-05 DIAGNOSIS — C50412 Malignant neoplasm of upper-outer quadrant of left female breast: Secondary | ICD-10-CM | POA: Insufficient documentation

## 2023-01-05 DIAGNOSIS — Z7982 Long term (current) use of aspirin: Secondary | ICD-10-CM | POA: Insufficient documentation

## 2023-01-05 DIAGNOSIS — Z79899 Other long term (current) drug therapy: Secondary | ICD-10-CM | POA: Insufficient documentation

## 2023-01-05 DIAGNOSIS — Z170 Estrogen receptor positive status [ER+]: Secondary | ICD-10-CM | POA: Insufficient documentation

## 2023-01-10 ENCOUNTER — Telehealth: Payer: Self-pay

## 2023-01-10 ENCOUNTER — Ambulatory Visit: Payer: MEDICAID

## 2023-01-10 NOTE — Telephone Encounter (Signed)
Rn called pt to let her now that she will only need 5 weeks of radiation per Dr. Basilio Cairo. Pt was pleased she will not need the 6th week since she has healed so well. Pt was grateful for the phone calls and had no questions at this time.

## 2023-01-11 NOTE — Therapy (Signed)
OUTPATIENT PHYSICAL THERAPY BREAST CANCER POST OP FOLLOW UP   Patient Name: Bethany Mills MRN: [id number] DOB:1961-01-03, 62 y.o., female Today's Date: 01/12/2023  END OF SESSION:   Past Medical History:  Diagnosis Date   Allergies    Anxiety    COPD (chronic obstructive pulmonary disease) (HCC)    Hyperglycemic coma (HCC)    Hypertension    Past Surgical History:  Procedure Laterality Date   ABDOMINAL HYSTERECTOMY     BREAST BIOPSY Left 11/13/2022   [place] LT BREAST BX W LOC DEV 1ST LESION IMG BX SPEC US GUIDE 11/13/2022 [facility]   BREAST BIOPSY  12/05/2022   MM LT RADIOACTIVE SEED LOC MAMMO GUIDE 12/05/2022 [facility]   BREAST LUMPECTOMY WITH RADIOACTIVE SEED AND SENTINEL LYMPH NODE BIOPSY Left 12/06/2022   Procedure: LEFT BREAST SEED GUIDED LUMPECTOMY, LEFT AXILLARY SENTINEL NODE BIOPSY;  Surgeon: Emelia Loron, MD;  Location: [facility];  Service: General;  Laterality: Left;  PEC BLOCK   Patient Active Problem List   Diagnosis Date Noted   Dermatitis 12/04/2022   Situational anxiety 11/21/2022   Malignant neoplasm of upper-outer quadrant of left breast in female, estrogen receptor positive (HCC) 11/20/2022   Physical exam, annual 10/11/2022   Cervical cancer screening 10/11/2022   Mixed hyperlipidemia 10/11/2022   Altered mental status 10/03/2022   Encounter to establish care with new doctor 10/03/2022   DDD (degenerative disc disease), cervical 02/21/2013   Tobacco dependence 02/21/2013   POST-TRAUMATIC HEADACHE UNSPECIFIED 03/08/2010   HEMATEMESIS 03/08/2010   BACK PAIN, LUMBAR 03/08/2010   ABDOMINAL PAIN, EPIGASTRIC 03/08/2010   CERVICAL STRAIN 03/08/2010   RECTAL BLEEDING 12/20/2009   WEAKNESS 12/20/2009   PELVIC  PAIN 12/20/2009   TUBULOVILLOUS ADENOMA, COLON, 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 Emelia Loron  REFERRING DIAG: Left Breast Cancer  THERAPY DIAG:  Malignant neoplasm of upper-outer quadrant of  left breast in female, estrogen receptor positive (HCC)  Aftercare following surgery for neoplasm  At risk for lymphedema  Rationale for Evaluation and Treatment: Rehabilitation  ONSET DATE: 10/31/2022  SUBJECTIVE:                                                                                                                                                                                           SUBJECTIVE STATEMENT: I feel really good.    PERTINENT HISTORY:  Patient was diagnosed on 10/31/2022 with left grade 1 invasive ductal carcinoma breast cancer. It measures 1.2 cm and is located in the upper outer quadrant. It is ER/PR positive and HER2 negative with a Ki67 of 10%.  She had  a left lumpectomy with SLNB on8/30/2024 with 1+/1 LN. She does not want chemotherapy but will have radiation and Anastrozole.She smokes 1 pack per day.   PATIENT GOALS:  Reassess how my recovery is going related to arm function, pain, and swelling.  PAIN:  Are you having pain? Not really   PRECAUTIONS: Recent Surgery, left UE Lymphedema risk,   RED FLAGS: None   ACTIVITY LEVEL / LEISURE: back to normal   OBJECTIVE:   PATIENT SURVEYS:  QUICK DASH: 18%  OBSERVATIONS: Well healed   POSTURE:  Forward head and rounded shoulders posture   LYMPHEDEMA ASSESSMENT:  UPPER EXTREMITY AROM/PROM:   A/PROM RIGHT   eval    Shoulder extension 57  Shoulder flexion 150  Shoulder abduction 162  Shoulder internal rotation 60  Shoulder external rotation 84                          (Blank rows = not tested)   A/PROM LEFT   eval LEFT 01/10/2023  Shoulder extension 55 70  Shoulder flexion 149 160  Shoulder abduction 160 175  Shoulder internal rotation 54   Shoulder external rotation 86 88                          (Blank rows = not tested)   CERVICAL AROM: All within normal limits:      Percent limited  Flexion WNL  Extension 50% limited  Right lateral flexion 50% limited  Left lateral flexion  50% limited  Right rotation 50% limited  Left rotation 50% limited      UPPER EXTREMITY STRENGTH: WNL   LYMPHEDEMA ASSESSMENTS:    LANDMARK RIGHT   eval  10 cm proximal to olecranon process 27.1  Olecranon process 23.8  10 cm proximal to ulnar styloid process 21  Just proximal to ulnar styloid process 15.4  Across hand at thumb web space 17.2  At base of 2nd digit 6  (Blank rows = not tested)   LANDMARK LEFT   eval LEFT 01/10/2023  10 cm proximal to olecranon process 26.3 26.4  Olecranon process 24.2 24  10  cm proximal to ulnar styloid process 20.2 20.2  Just proximal to ulnar styloid process 15.9 15.9  Across hand at thumb web space 17.4 17.4  At base of 2nd digit 5.8 5.8  (Blank rows = not tested)     Surgery type/Date: 12/06/2022, Left lumpectomy SLNB Number of lymph nodes removed: 1+/1  PATIENT EDUCATION:  Education details: post op care per below Person educated: Patient Education method: Explanation Education comprehension: verbalized understanding  HOME EXERCISE PROGRAM: Reviewed previously given post op HEP.   ASSESSMENT:  CLINICAL IMPRESSION: Pt is doing very well after lumpectomy and has returned to baseline AROM and activity level.   Pt will benefit from skilled therapeutic intervention to improve on the following deficits: Decreased knowledge of precautions, impaired UE functional use, pain, decreased ROM, postural dysfunction.   PT treatment/interventions: ADL/Self care home management, Patient/Family education, Self Care, and Re-evaluation   GOALS: Goals reviewed with patient? Yes  LONG TERM GOALS:  (STG=LTG)  GOALS Name Target Date  Goal status  1 Pt will demonstrate she has regained full shoulder ROM and function post operatively compared to baselines.  Baseline: 01/12/23 MET                    PLAN:  PT FREQUENCY/DURATION: SOZO only  PLAN FOR NEXT  SESSION: SOZO only   [facility]  [address], [address]  [place] [address]  [phone]  After Breast Cancer Class It is recommended you attend the ABC class to be educated on lymphedema risk reduction. This class is free of charge and lasts for 1 hour. It is a 1-time class. You will need to download the TEAMS app either on your phone or computer. We will send you a link the night before or the morning of the class. You should be able to click on that link to join the class. This is not a confidential class. You don't have to turn your camera on, but other participants may be able to see your email address.  Scar massage You can begin gentle scar massage to you incision sites. Gently place one hand on the incision and move the skin (without sliding on the skin) in various directions. Do this for a few minutes and then you can gently massage either coconut oil or vitamin E cream into the scars.  Compression garment You should continue wearing your compression bra until you feel like you no longer have swelling.  Home exercise Program Continue doing the exercises you were given until you feel like you can do them without feeling any tightness at the end.   Walking Program Studies show that 30 minutes of walking per day (fast enough to elevate your heart rate) can significantly reduce the risk of a cancer recurrence. If you can't walk due to other medical reasons, we encourage you to find another activity you could do (like a stationary bike or water exercise).  Posture After breast cancer surgery, people frequently sit with rounded shoulders posture because it puts their incisions on slack and feels better. If you sit like this and scar tissue forms in that position, you can become very tight and have pain sitting or standing with good posture. Try to be aware of your posture and sit and stand up tall to heal properly.  Follow up PT: It is recommended you return every 3 months for the first 3 years following surgery to be assessed on the  SOZO machine for an L-Dex score. This helps prevent clinically significant lymphedema in 95% of patients. These follow up screens are 10 minute appointments that you are not billed for.  Idamae Lusher, PT 01/12/2023, 7:57 AM

## 2023-01-12 ENCOUNTER — Ambulatory Visit: Payer: MEDICAID | Attending: General Surgery | Admitting: Rehabilitation

## 2023-01-12 ENCOUNTER — Encounter: Payer: Self-pay | Admitting: *Deleted

## 2023-01-12 ENCOUNTER — Encounter: Payer: Self-pay | Admitting: Rehabilitation

## 2023-01-12 DIAGNOSIS — Z9189 Other specified personal risk factors, not elsewhere classified: Secondary | ICD-10-CM | POA: Diagnosis present

## 2023-01-12 DIAGNOSIS — Z170 Estrogen receptor positive status [ER+]: Secondary | ICD-10-CM | POA: Insufficient documentation

## 2023-01-12 DIAGNOSIS — C50412 Malignant neoplasm of upper-outer quadrant of left female breast: Secondary | ICD-10-CM

## 2023-01-12 DIAGNOSIS — Z483 Aftercare following surgery for neoplasm: Secondary | ICD-10-CM | POA: Insufficient documentation

## 2023-01-12 DIAGNOSIS — Z510 Encounter for antineoplastic radiation therapy: Secondary | ICD-10-CM | POA: Diagnosis present

## 2023-01-18 ENCOUNTER — Other Ambulatory Visit: Payer: Self-pay | Admitting: Family Medicine

## 2023-01-18 DIAGNOSIS — I10 Essential (primary) hypertension: Secondary | ICD-10-CM

## 2023-01-22 ENCOUNTER — Ambulatory Visit: Payer: MEDICAID

## 2023-01-22 ENCOUNTER — Ambulatory Visit: Payer: MEDICAID | Admitting: Radiation Oncology

## 2023-01-23 ENCOUNTER — Ambulatory Visit
Admission: RE | Admit: 2023-01-23 | Discharge: 2023-01-23 | Disposition: A | Discharge status: Home / Self Care | Payer: MEDICAID | Source: Ambulatory Visit | Attending: Radiation Oncology | Admitting: Radiation Oncology

## 2023-01-23 ENCOUNTER — Other Ambulatory Visit: Payer: Self-pay

## 2023-01-23 DIAGNOSIS — Z510 Encounter for antineoplastic radiation therapy: Secondary | ICD-10-CM | POA: Diagnosis not present

## 2023-01-23 LAB — RAD ONC ARIA SESSION SUMMARY
Course Elapsed Days: 0
Plan Fractions Treated to Date: 1
Plan Fractions Treated to Date: 1
Plan Prescribed Dose Per Fraction: 2 Gy
Plan Prescribed Dose Per Fraction: 2 Gy
Plan Total Fractions Prescribed: 25
Plan Total Fractions Prescribed: 25
Plan Total Prescribed Dose: 50 Gy
Plan Total Prescribed Dose: 50 Gy
Reference Point Dosage Given to Date: 2 Gy
Reference Point Dosage Given to Date: 2 Gy
Reference Point Session Dosage Given: 2 Gy
Reference Point Session Dosage Given: 2 Gy
Session Number: 1

## 2023-01-24 ENCOUNTER — Ambulatory Visit
Admission: RE | Admit: 2023-01-24 | Discharge: 2023-01-24 | Disposition: A | Discharge status: Home / Self Care | Payer: MEDICAID | Source: Ambulatory Visit | Attending: Radiation Oncology | Admitting: Radiation Oncology

## 2023-01-24 ENCOUNTER — Other Ambulatory Visit: Payer: Self-pay | Admitting: Family Medicine

## 2023-01-24 ENCOUNTER — Other Ambulatory Visit: Payer: Self-pay

## 2023-01-24 DIAGNOSIS — Z510 Encounter for antineoplastic radiation therapy: Secondary | ICD-10-CM | POA: Diagnosis not present

## 2023-01-24 LAB — RAD ONC ARIA SESSION SUMMARY
Course Elapsed Days: 1
Plan Fractions Treated to Date: 2
Plan Fractions Treated to Date: 2
Plan Prescribed Dose Per Fraction: 2 Gy
Plan Prescribed Dose Per Fraction: 2 Gy
Plan Total Fractions Prescribed: 25
Plan Total Fractions Prescribed: 25
Plan Total Prescribed Dose: 50 Gy
Plan Total Prescribed Dose: 50 Gy
Reference Point Dosage Given to Date: 4 Gy
Reference Point Dosage Given to Date: 4 Gy
Reference Point Session Dosage Given: 2 Gy
Reference Point Session Dosage Given: 2 Gy
Session Number: 2

## 2023-01-25 ENCOUNTER — Ambulatory Visit
Admission: RE | Admit: 2023-01-25 | Discharge: 2023-01-25 | Disposition: A | Discharge status: Home / Self Care | Payer: MEDICAID | Source: Ambulatory Visit | Attending: Radiation Oncology | Admitting: Radiation Oncology

## 2023-01-25 ENCOUNTER — Other Ambulatory Visit: Payer: Self-pay

## 2023-01-25 DIAGNOSIS — Z510 Encounter for antineoplastic radiation therapy: Secondary | ICD-10-CM | POA: Diagnosis not present

## 2023-01-25 LAB — RAD ONC ARIA SESSION SUMMARY
Course Elapsed Days: 2
Plan Fractions Treated to Date: 3
Plan Fractions Treated to Date: 3
Plan Prescribed Dose Per Fraction: 2 Gy
Plan Prescribed Dose Per Fraction: 2 Gy
Plan Total Fractions Prescribed: 25
Plan Total Fractions Prescribed: 25
Plan Total Prescribed Dose: 50 Gy
Plan Total Prescribed Dose: 50 Gy
Reference Point Dosage Given to Date: 6 Gy
Reference Point Dosage Given to Date: 6 Gy
Reference Point Session Dosage Given: 2 Gy
Reference Point Session Dosage Given: 2 Gy
Session Number: 3

## 2023-01-26 ENCOUNTER — Other Ambulatory Visit: Payer: Self-pay

## 2023-01-26 ENCOUNTER — Ambulatory Visit
Admission: RE | Admit: 2023-01-26 | Discharge: 2023-01-26 | Disposition: A | Discharge status: Home / Self Care | Payer: MEDICAID | Source: Ambulatory Visit | Attending: Radiation Oncology | Admitting: Radiation Oncology

## 2023-01-26 DIAGNOSIS — Z510 Encounter for antineoplastic radiation therapy: Secondary | ICD-10-CM | POA: Diagnosis not present

## 2023-01-26 LAB — RAD ONC ARIA SESSION SUMMARY
Course Elapsed Days: 3
Plan Fractions Treated to Date: 4
Plan Fractions Treated to Date: 4
Plan Prescribed Dose Per Fraction: 2 Gy
Plan Prescribed Dose Per Fraction: 2 Gy
Plan Total Fractions Prescribed: 25
Plan Total Fractions Prescribed: 25
Plan Total Prescribed Dose: 50 Gy
Plan Total Prescribed Dose: 50 Gy
Reference Point Dosage Given to Date: 8 Gy
Reference Point Dosage Given to Date: 8 Gy
Reference Point Session Dosage Given: 2 Gy
Reference Point Session Dosage Given: 2 Gy
Session Number: 4

## 2023-01-29 ENCOUNTER — Ambulatory Visit
Admission: RE | Admit: 2023-01-29 | Discharge: 2023-01-29 | Disposition: A | Discharge status: Home / Self Care | Payer: MEDICAID | Source: Ambulatory Visit | Attending: Radiation Oncology | Admitting: Radiation Oncology

## 2023-01-29 ENCOUNTER — Ambulatory Visit
Admission: RE | Admit: 2023-01-29 | Discharge: 2023-01-29 | Disposition: A | Discharge status: Home / Self Care | Payer: MEDICAID | Source: Ambulatory Visit | Attending: Radiation Oncology

## 2023-01-29 ENCOUNTER — Other Ambulatory Visit: Payer: Self-pay

## 2023-01-29 DIAGNOSIS — Z170 Estrogen receptor positive status [ER+]: Secondary | ICD-10-CM

## 2023-01-29 DIAGNOSIS — Z510 Encounter for antineoplastic radiation therapy: Secondary | ICD-10-CM | POA: Diagnosis not present

## 2023-01-29 LAB — RAD ONC ARIA SESSION SUMMARY
Course Elapsed Days: 6
Plan Fractions Treated to Date: 5
Plan Fractions Treated to Date: 5
Plan Prescribed Dose Per Fraction: 2 Gy
Plan Prescribed Dose Per Fraction: 2 Gy
Plan Total Fractions Prescribed: 25
Plan Total Fractions Prescribed: 25
Plan Total Prescribed Dose: 50 Gy
Plan Total Prescribed Dose: 50 Gy
Reference Point Dosage Given to Date: 10 Gy
Reference Point Dosage Given to Date: 10 Gy
Reference Point Session Dosage Given: 2 Gy
Reference Point Session Dosage Given: 2 Gy
Session Number: 5

## 2023-01-29 MED ORDER — RADIAPLEXRX EX GEL
Freq: Once | CUTANEOUS | Status: AC
  Administered 2023-01-29: 1 via TOPICAL

## 2023-01-29 MED ORDER — ALRA NON-METALLIC DEODORANT (RAD-ONC)
1.0000 | Freq: Once | TOPICAL | Status: AC
  Administered 2023-01-29: 1 via TOPICAL

## 2023-01-30 ENCOUNTER — Ambulatory Visit
Admission: RE | Admit: 2023-01-30 | Discharge: 2023-01-30 | Disposition: A | Discharge status: Home / Self Care | Payer: MEDICAID | Source: Ambulatory Visit | Attending: Radiation Oncology

## 2023-01-30 ENCOUNTER — Other Ambulatory Visit: Payer: Self-pay

## 2023-01-30 DIAGNOSIS — Z510 Encounter for antineoplastic radiation therapy: Secondary | ICD-10-CM | POA: Diagnosis not present

## 2023-01-30 LAB — RAD ONC ARIA SESSION SUMMARY
Course Elapsed Days: 7
Plan Fractions Treated to Date: 6
Plan Fractions Treated to Date: 6
Plan Prescribed Dose Per Fraction: 2 Gy
Plan Prescribed Dose Per Fraction: 2 Gy
Plan Total Fractions Prescribed: 25
Plan Total Fractions Prescribed: 25
Plan Total Prescribed Dose: 50 Gy
Plan Total Prescribed Dose: 50 Gy
Reference Point Dosage Given to Date: 12 Gy
Reference Point Dosage Given to Date: 12 Gy
Reference Point Session Dosage Given: 2 Gy
Reference Point Session Dosage Given: 2 Gy
Session Number: 6

## 2023-01-31 ENCOUNTER — Other Ambulatory Visit: Payer: Self-pay

## 2023-01-31 ENCOUNTER — Ambulatory Visit
Admission: RE | Admit: 2023-01-31 | Discharge: 2023-01-31 | Disposition: A | Discharge status: Home / Self Care | Payer: MEDICAID | Source: Ambulatory Visit | Attending: Radiation Oncology | Admitting: Radiation Oncology

## 2023-01-31 DIAGNOSIS — Z510 Encounter for antineoplastic radiation therapy: Secondary | ICD-10-CM | POA: Diagnosis not present

## 2023-01-31 LAB — RAD ONC ARIA SESSION SUMMARY
Course Elapsed Days: 8
Plan Fractions Treated to Date: 7
Plan Fractions Treated to Date: 7
Plan Prescribed Dose Per Fraction: 2 Gy
Plan Prescribed Dose Per Fraction: 2 Gy
Plan Total Fractions Prescribed: 25
Plan Total Fractions Prescribed: 25
Plan Total Prescribed Dose: 50 Gy
Plan Total Prescribed Dose: 50 Gy
Reference Point Dosage Given to Date: 14 Gy
Reference Point Dosage Given to Date: 14 Gy
Reference Point Session Dosage Given: 2 Gy
Reference Point Session Dosage Given: 2 Gy
Session Number: 7

## 2023-02-01 ENCOUNTER — Ambulatory Visit
Admission: RE | Admit: 2023-02-01 | Discharge: 2023-02-01 | Disposition: A | Discharge status: Home / Self Care | Payer: MEDICAID | Source: Ambulatory Visit | Attending: Radiation Oncology | Admitting: Radiation Oncology

## 2023-02-01 ENCOUNTER — Other Ambulatory Visit: Payer: Self-pay

## 2023-02-01 DIAGNOSIS — Z510 Encounter for antineoplastic radiation therapy: Secondary | ICD-10-CM | POA: Diagnosis not present

## 2023-02-01 LAB — RAD ONC ARIA SESSION SUMMARY
Course Elapsed Days: 9
Plan Fractions Treated to Date: 8
Plan Fractions Treated to Date: 8
Plan Prescribed Dose Per Fraction: 2 Gy
Plan Prescribed Dose Per Fraction: 2 Gy
Plan Total Fractions Prescribed: 25
Plan Total Fractions Prescribed: 25
Plan Total Prescribed Dose: 50 Gy
Plan Total Prescribed Dose: 50 Gy
Reference Point Dosage Given to Date: 16 Gy
Reference Point Dosage Given to Date: 16 Gy
Reference Point Session Dosage Given: 2 Gy
Reference Point Session Dosage Given: 2 Gy
Session Number: 8

## 2023-02-02 ENCOUNTER — Ambulatory Visit
Admission: RE | Admit: 2023-02-02 | Discharge: 2023-02-02 | Disposition: A | Discharge status: Home / Self Care | Payer: MEDICAID | Source: Ambulatory Visit | Attending: Radiation Oncology

## 2023-02-02 ENCOUNTER — Other Ambulatory Visit: Payer: Self-pay

## 2023-02-02 DIAGNOSIS — Z510 Encounter for antineoplastic radiation therapy: Secondary | ICD-10-CM | POA: Diagnosis not present

## 2023-02-02 LAB — RAD ONC ARIA SESSION SUMMARY
Course Elapsed Days: 10
Plan Fractions Treated to Date: 9
Plan Fractions Treated to Date: 9
Plan Prescribed Dose Per Fraction: 2 Gy
Plan Prescribed Dose Per Fraction: 2 Gy
Plan Total Fractions Prescribed: 25
Plan Total Fractions Prescribed: 25
Plan Total Prescribed Dose: 50 Gy
Plan Total Prescribed Dose: 50 Gy
Reference Point Dosage Given to Date: 18 Gy
Reference Point Dosage Given to Date: 18 Gy
Reference Point Session Dosage Given: 2 Gy
Reference Point Session Dosage Given: 2 Gy
Session Number: 9

## 2023-02-05 ENCOUNTER — Other Ambulatory Visit: Payer: Self-pay

## 2023-02-05 ENCOUNTER — Ambulatory Visit
Admission: RE | Admit: 2023-02-05 | Discharge: 2023-02-05 | Disposition: A | Discharge status: Home / Self Care | Payer: MEDICAID | Source: Ambulatory Visit | Attending: Radiation Oncology

## 2023-02-05 ENCOUNTER — Other Ambulatory Visit: Payer: Self-pay | Admitting: Family Medicine

## 2023-02-05 ENCOUNTER — Ambulatory Visit
Admission: RE | Admit: 2023-02-05 | Discharge: 2023-02-05 | Disposition: A | Discharge status: Home / Self Care | Payer: MEDICAID | Source: Ambulatory Visit | Attending: Radiation Oncology | Admitting: Radiation Oncology

## 2023-02-05 DIAGNOSIS — Z510 Encounter for antineoplastic radiation therapy: Secondary | ICD-10-CM | POA: Diagnosis not present

## 2023-02-05 LAB — RAD ONC ARIA SESSION SUMMARY
Course Elapsed Days: 13
Plan Fractions Treated to Date: 10
Plan Fractions Treated to Date: 10
Plan Prescribed Dose Per Fraction: 2 Gy
Plan Prescribed Dose Per Fraction: 2 Gy
Plan Total Fractions Prescribed: 25
Plan Total Fractions Prescribed: 25
Plan Total Prescribed Dose: 50 Gy
Plan Total Prescribed Dose: 50 Gy
Reference Point Dosage Given to Date: 20 Gy
Reference Point Dosage Given to Date: 20 Gy
Reference Point Session Dosage Given: 2 Gy
Reference Point Session Dosage Given: 2 Gy
Session Number: 10

## 2023-02-06 ENCOUNTER — Other Ambulatory Visit: Payer: Self-pay

## 2023-02-06 ENCOUNTER — Ambulatory Visit
Admission: RE | Admit: 2023-02-06 | Discharge: 2023-02-06 | Disposition: A | Discharge status: Home / Self Care | Payer: MEDICAID | Source: Ambulatory Visit | Attending: Radiation Oncology | Admitting: Radiation Oncology

## 2023-02-06 DIAGNOSIS — C50412 Malignant neoplasm of upper-outer quadrant of left female breast: Secondary | ICD-10-CM | POA: Insufficient documentation

## 2023-02-06 DIAGNOSIS — C773 Secondary and unspecified malignant neoplasm of axilla and upper limb lymph nodes: Secondary | ICD-10-CM | POA: Diagnosis not present

## 2023-02-06 DIAGNOSIS — Z170 Estrogen receptor positive status [ER+]: Secondary | ICD-10-CM | POA: Diagnosis present

## 2023-02-06 DIAGNOSIS — Z510 Encounter for antineoplastic radiation therapy: Secondary | ICD-10-CM | POA: Insufficient documentation

## 2023-02-06 LAB — RAD ONC ARIA SESSION SUMMARY
Course Elapsed Days: 14
Plan Fractions Treated to Date: 11
Plan Fractions Treated to Date: 11
Plan Prescribed Dose Per Fraction: 2 Gy
Plan Prescribed Dose Per Fraction: 2 Gy
Plan Total Fractions Prescribed: 25
Plan Total Fractions Prescribed: 25
Plan Total Prescribed Dose: 50 Gy
Plan Total Prescribed Dose: 50 Gy
Reference Point Dosage Given to Date: 22 Gy
Reference Point Dosage Given to Date: 22 Gy
Reference Point Session Dosage Given: 2 Gy
Reference Point Session Dosage Given: 2 Gy
Session Number: 11

## 2023-02-07 ENCOUNTER — Ambulatory Visit (INDEPENDENT_AMBULATORY_CARE_PROVIDER_SITE_OTHER): Payer: MEDICAID | Admitting: Family Medicine

## 2023-02-07 ENCOUNTER — Other Ambulatory Visit: Payer: Self-pay

## 2023-02-07 ENCOUNTER — Ambulatory Visit
Admission: RE | Admit: 2023-02-07 | Discharge: 2023-02-07 | Disposition: A | Discharge status: Home / Self Care | Payer: MEDICAID | Source: Ambulatory Visit | Attending: Radiation Oncology

## 2023-02-07 ENCOUNTER — Encounter: Payer: Self-pay | Admitting: Family Medicine

## 2023-02-07 VITALS — BP 128/68 | HR 79 | Temp 98.2°F | Ht 63.0 in | Wt 146.0 lb

## 2023-02-07 DIAGNOSIS — L301 Dyshidrosis [pompholyx]: Secondary | ICD-10-CM | POA: Insufficient documentation

## 2023-02-07 DIAGNOSIS — Z510 Encounter for antineoplastic radiation therapy: Secondary | ICD-10-CM | POA: Diagnosis not present

## 2023-02-07 LAB — RAD ONC ARIA SESSION SUMMARY
Course Elapsed Days: 15
Plan Fractions Treated to Date: 12
Plan Fractions Treated to Date: 12
Plan Prescribed Dose Per Fraction: 2 Gy
Plan Prescribed Dose Per Fraction: 2 Gy
Plan Total Fractions Prescribed: 25
Plan Total Fractions Prescribed: 25
Plan Total Prescribed Dose: 50 Gy
Plan Total Prescribed Dose: 50 Gy
Reference Point Dosage Given to Date: 24 Gy
Reference Point Dosage Given to Date: 24 Gy
Reference Point Session Dosage Given: 2 Gy
Reference Point Session Dosage Given: 2 Gy
Session Number: 12

## 2023-02-07 NOTE — Progress Notes (Signed)
Subjective:  HPI: Bethany Mills is a 62 y.o. female presenting on 02/07/2023 for No chief complaint on file.   HPI Patient is in today for 1 month of rash to bilateral palms near the medial side. This is a painful rash. She has tried Cortizone 10 for several weeks and just tried Triamcinolone/Betamethasone today with some relief. She endorses itching and draining.  ROS  Relevant past medical history reviewed and updated as indicated.   Past Medical History:  Diagnosis Date   Allergies    Anxiety    COPD (chronic obstructive pulmonary disease) (HCC)    Hyperglycemic coma (HCC)    Hypertension      Past Surgical History:  Procedure Laterality Date   ABDOMINAL HYSTERECTOMY     BREAST BIOPSY Left 11/13/2022   [place] LT BREAST BX W LOC DEV 1ST LESION IMG BX SPEC US GUIDE 11/13/2022 [facility]   BREAST BIOPSY  12/05/2022   MM LT RADIOACTIVE SEED LOC MAMMO GUIDE 12/05/2022 [facility]   BREAST LUMPECTOMY WITH RADIOACTIVE SEED AND SENTINEL LYMPH NODE BIOPSY Left 12/06/2022   Procedure: LEFT BREAST SEED GUIDED LUMPECTOMY, LEFT AXILLARY SENTINEL NODE BIOPSY;  Surgeon: Emelia Loron, MD;  Location: [facility];  Service: General;  Laterality: Left;  PEC BLOCK    Allergies and medications reviewed and updated.   Current Outpatient Medications:    ALPRAZolam (XANAX) 0.5 MG tablet, TAKE 1-2 TABLETS (0.5-1 MG TOTAL) BY MOUTH 3 (THREE) TIMES DAILY AS NEEDED FOR ANXIETY., Disp: 90 tablet, Rfl: 0   amLODipine (NORVASC) 10 MG tablet, TAKE 1 TABLET BY MOUTH EVERY DAY, Disp: 90 tablet, Rfl: 2   aspirin 81 MG chewable tablet, Chew 1 tablet (81 mg total) by mouth daily., Disp: 30 tablet, Rfl: 0   busPIRone (BUSPAR) 5 MG tablet, Take 5 mg by mouth 2 (two) times daily., Disp: , Rfl:    clotrimazole-betamethasone (LOTRISONE) cream, Apply 1 Application topically daily., Disp: 30 g, Rfl: 0   rosuvastatin (CRESTOR) 20 MG tablet, Take 1 tablet (20 mg total) by mouth daily.,  Disp: 90 tablet, Rfl: 3   oxyCODONE (OXY IR/ROXICODONE) 5 MG immediate release tablet, Take 1 tablet (5 mg total) by mouth every 6 (six) hours as needed. (Patient not taking: Reported on 02/07/2023), Disp: 10 tablet, Rfl: 0  Allergies  Allergen Reactions   Codeine Nausea And Vomiting   Neomycin-Polymyxin B Gu Other (See Comments)    Causes eyes to swell, run and crust over.    Neomycin-Bacitracin Zn-Polymyx Hives and Rash    Objective:   BP 128/68   Pulse 79   Temp 98.2 F (36.8 C) (Oral)   Ht 5\' 3"  (1.6 m)   Wt 146 lb (66.2 kg)   SpO2 94%   BMI 25.86 kg/m      02/07/2023    2:35 PM 01/05/2023    1:08 PM 12/21/2022    3:08 PM  Vitals with BMI  Height 5\' 3"  5' 3.5" 5\' 3"   Weight 146 lbs 146 lbs 10 oz 144 lbs 8 oz  BMI 25.87 25.56 25.6  Systolic 128 137 604  Diastolic 68 68 109  Pulse 79 77 85     Physical Exam Vitals and nursing note reviewed.  Constitutional:      Appearance: Normal appearance. She is normal weight.  HENT:     Head: Normocephalic and atraumatic.  Skin:    General: Skin is warm and dry.     Findings: Rash present. Rash is vesicular.  Neurological:     General: No focal deficit present.     Mental Status: She is alert and oriented to person, place, and time. Mental status is at baseline.  Psychiatric:        Mood and Affect: Mood normal.        Behavior: Behavior normal.        Thought Content: Thought content normal.        Judgment: Judgment normal.     Assessment & Plan:  Dyshidrotic eczema Assessment & Plan: Patinet presents with vesicular and blistering rash to medial aspect of both palms. Has tried Hydrocortisone 2.5% without relief. Is currently using Triamcinolone and Betamethasone cream with improvement in symptoms so will continue this. If symptoms persist longer than 2 weeks or worsen return to office.      Follow up plan: Return if symptoms worsen or fail to improve.  Park Meo, FNP

## 2023-02-07 NOTE — Assessment & Plan Note (Signed)
Patinet presents with vesicular and blistering rash to medial aspect of both palms. Has tried Hydrocortisone 2.5% without relief. Is currently using Triamcinolone and Betamethasone cream with improvement in symptoms so will continue this. If symptoms persist longer than 2 weeks or worsen return to office.

## 2023-02-08 ENCOUNTER — Ambulatory Visit
Admission: RE | Admit: 2023-02-08 | Discharge: 2023-02-08 | Disposition: A | Discharge status: Home / Self Care | Payer: MEDICAID | Source: Ambulatory Visit | Attending: Radiation Oncology | Admitting: Radiation Oncology

## 2023-02-08 ENCOUNTER — Other Ambulatory Visit: Payer: Self-pay

## 2023-02-08 DIAGNOSIS — Z510 Encounter for antineoplastic radiation therapy: Secondary | ICD-10-CM | POA: Diagnosis not present

## 2023-02-08 LAB — RAD ONC ARIA SESSION SUMMARY
Course Elapsed Days: 16
Plan Fractions Treated to Date: 13
Plan Fractions Treated to Date: 13
Plan Prescribed Dose Per Fraction: 2 Gy
Plan Prescribed Dose Per Fraction: 2 Gy
Plan Total Fractions Prescribed: 25
Plan Total Fractions Prescribed: 25
Plan Total Prescribed Dose: 50 Gy
Plan Total Prescribed Dose: 50 Gy
Reference Point Dosage Given to Date: 26 Gy
Reference Point Dosage Given to Date: 26 Gy
Reference Point Session Dosage Given: 2 Gy
Reference Point Session Dosage Given: 2 Gy
Session Number: 13

## 2023-02-09 ENCOUNTER — Ambulatory Visit
Admission: RE | Admit: 2023-02-09 | Discharge: 2023-02-09 | Disposition: A | Discharge status: Home / Self Care | Payer: MEDICAID | Source: Ambulatory Visit | Attending: Radiation Oncology

## 2023-02-09 ENCOUNTER — Other Ambulatory Visit: Payer: Self-pay

## 2023-02-09 ENCOUNTER — Ambulatory Visit
Admission: RE | Admit: 2023-02-09 | Discharge: 2023-02-09 | Disposition: A | Discharge status: Home / Self Care | Payer: MEDICAID | Source: Ambulatory Visit | Attending: Radiation Oncology | Admitting: Radiation Oncology

## 2023-02-09 DIAGNOSIS — Z510 Encounter for antineoplastic radiation therapy: Secondary | ICD-10-CM | POA: Diagnosis not present

## 2023-02-09 LAB — RAD ONC ARIA SESSION SUMMARY
Course Elapsed Days: 17
Plan Fractions Treated to Date: 14
Plan Fractions Treated to Date: 14
Plan Prescribed Dose Per Fraction: 2 Gy
Plan Prescribed Dose Per Fraction: 2 Gy
Plan Total Fractions Prescribed: 25
Plan Total Fractions Prescribed: 25
Plan Total Prescribed Dose: 50 Gy
Plan Total Prescribed Dose: 50 Gy
Reference Point Dosage Given to Date: 28 Gy
Reference Point Dosage Given to Date: 28 Gy
Reference Point Session Dosage Given: 2 Gy
Reference Point Session Dosage Given: 2 Gy
Session Number: 14

## 2023-02-12 ENCOUNTER — Telehealth: Payer: Self-pay

## 2023-02-12 ENCOUNTER — Other Ambulatory Visit: Payer: Self-pay

## 2023-02-12 ENCOUNTER — Ambulatory Visit
Admission: RE | Admit: 2023-02-12 | Discharge: 2023-02-12 | Disposition: A | Discharge status: Home / Self Care | Payer: MEDICAID | Source: Ambulatory Visit | Attending: Radiation Oncology | Admitting: Radiation Oncology

## 2023-02-12 DIAGNOSIS — Z510 Encounter for antineoplastic radiation therapy: Secondary | ICD-10-CM | POA: Diagnosis not present

## 2023-02-12 LAB — RAD ONC ARIA SESSION SUMMARY
Course Elapsed Days: 20
Plan Fractions Treated to Date: 15
Plan Fractions Treated to Date: 15
Plan Prescribed Dose Per Fraction: 2 Gy
Plan Prescribed Dose Per Fraction: 2 Gy
Plan Total Fractions Prescribed: 25
Plan Total Fractions Prescribed: 25
Plan Total Prescribed Dose: 50 Gy
Plan Total Prescribed Dose: 50 Gy
Reference Point Dosage Given to Date: 30 Gy
Reference Point Dosage Given to Date: 30 Gy
Reference Point Session Dosage Given: 2 Gy
Reference Point Session Dosage Given: 2 Gy
Session Number: 15

## 2023-02-12 NOTE — Telephone Encounter (Signed)
Pt came into office from her cancer treatment appt. Per the cancer dr to come in to have her hands looked at. Per pt it is blistering, dry, crack and painful. Per pt stated that her cream-clotrimazole is not working. Pt's concerns is the blisters on her hand.   Advice FNP is aware. Suggest pt go to UC if needed to since FNP doesn't have any available time unitl 10/14.   Pls advise?

## 2023-02-13 ENCOUNTER — Other Ambulatory Visit: Payer: Self-pay | Admitting: Family Medicine

## 2023-02-13 ENCOUNTER — Other Ambulatory Visit: Payer: Self-pay

## 2023-02-13 ENCOUNTER — Ambulatory Visit
Admission: RE | Admit: 2023-02-13 | Discharge: 2023-02-13 | Disposition: A | Discharge status: Home / Self Care | Payer: MEDICAID | Source: Ambulatory Visit | Attending: Radiation Oncology | Admitting: Radiation Oncology

## 2023-02-13 DIAGNOSIS — Z510 Encounter for antineoplastic radiation therapy: Secondary | ICD-10-CM | POA: Diagnosis not present

## 2023-02-13 LAB — RAD ONC ARIA SESSION SUMMARY
Course Elapsed Days: 21
Plan Fractions Treated to Date: 16
Plan Fractions Treated to Date: 16
Plan Prescribed Dose Per Fraction: 2 Gy
Plan Prescribed Dose Per Fraction: 2 Gy
Plan Total Fractions Prescribed: 25
Plan Total Fractions Prescribed: 25
Plan Total Prescribed Dose: 50 Gy
Plan Total Prescribed Dose: 50 Gy
Reference Point Dosage Given to Date: 32 Gy
Reference Point Dosage Given to Date: 32 Gy
Reference Point Session Dosage Given: 2 Gy
Reference Point Session Dosage Given: 2 Gy
Session Number: 16

## 2023-02-13 MED ORDER — MOMETASONE FUROATE 0.1 % EX CREA: TOPICAL_CREAM | CUTANEOUS | 1 refills | Status: DC

## 2023-02-14 ENCOUNTER — Ambulatory Visit
Admission: RE | Admit: 2023-02-14 | Discharge: 2023-02-14 | Disposition: A | Discharge status: Home / Self Care | Payer: MEDICAID | Source: Ambulatory Visit | Attending: Radiation Oncology

## 2023-02-14 ENCOUNTER — Telehealth: Payer: Self-pay

## 2023-02-14 ENCOUNTER — Ambulatory Visit: Payer: MEDICAID | Admitting: Family Medicine

## 2023-02-14 ENCOUNTER — Encounter: Payer: Self-pay | Admitting: Family Medicine

## 2023-02-14 ENCOUNTER — Encounter: Payer: Self-pay | Admitting: Radiation Oncology

## 2023-02-14 ENCOUNTER — Ambulatory Visit: Payer: MEDICAID

## 2023-02-14 VITALS — BP 130/82 | HR 76 | Temp 97.6°F | Ht 63.0 in | Wt 147.0 lb

## 2023-02-14 DIAGNOSIS — L301 Dyshidrosis [pompholyx]: Secondary | ICD-10-CM | POA: Diagnosis not present

## 2023-02-14 MED ORDER — PREDNISONE 20 MG PO TABS: ORAL_TABLET | ORAL | 0 refills | Status: DC

## 2023-02-14 NOTE — Progress Notes (Signed)
Subjective:  HPI: Bethany Mills is a 62 y.o. female presenting on 02/14/2023 for No chief complaint on file.   HPI Patient is in today for follow up for a painful rash to her bilateral hands that has been worsening over the last week. I did see her last week for this dyshidrotic rash that she was using Cortisone and Triamcinolone/Betamethasone cream for. She reports today that the rash is more painful, blistering, draining serous fluid, and she is not tolerating the creams due to pain. I did send in a stronger steroid cream to her pharmacy yesterday for this and she has not yet obtained this.  Similar rash is also present to her left chest wall where she is receiving radiation therapy for breast cancer.  Review of Systems  All other systems reviewed and are negative.   Relevant past medical history reviewed and updated as indicated.   Past Medical History:  Diagnosis Date   Allergies    Anxiety    COPD (chronic obstructive pulmonary disease) (HCC)    Hyperglycemic coma (HCC)    Hypertension      Past Surgical History:  Procedure Laterality Date   ABDOMINAL HYSTERECTOMY     BREAST BIOPSY Left 11/13/2022   [place] LT BREAST BX W LOC DEV 1ST LESION IMG BX SPEC US GUIDE 11/13/2022 [facility]   BREAST BIOPSY  12/05/2022   MM LT RADIOACTIVE SEED LOC MAMMO GUIDE 12/05/2022 [facility]   BREAST LUMPECTOMY WITH RADIOACTIVE SEED AND SENTINEL LYMPH NODE BIOPSY Left 12/06/2022   Procedure: LEFT BREAST SEED GUIDED LUMPECTOMY, LEFT AXILLARY SENTINEL NODE BIOPSY;  Surgeon: Emelia Loron, MD;  Location: [facility];  Service: General;  Laterality: Left;  PEC BLOCK    Allergies and medications reviewed and updated.   Current Outpatient Medications:    ALPRAZolam (XANAX) 0.5 MG tablet, TAKE 1-2 TABLETS (0.5-1 MG TOTAL) BY MOUTH 3 (THREE) TIMES DAILY AS NEEDED FOR ANXIETY., Disp: 90 tablet, Rfl: 0   amLODipine (NORVASC) 10 MG tablet, TAKE 1 TABLET BY MOUTH EVERY DAY,  Disp: 90 tablet, Rfl: 2   aspirin 81 MG chewable tablet, Chew 1 tablet (81 mg total) by mouth daily., Disp: 30 tablet, Rfl: 0   busPIRone (BUSPAR) 5 MG tablet, Take 5 mg by mouth 2 (two) times daily., Disp: , Rfl:    clotrimazole-betamethasone (LOTRISONE) cream, Apply 1 Application topically daily., Disp: 30 g, Rfl: 0   mometasone (ELOCON) 0.1 % cream, Apply to hands twice daily for up to 4 weeks, Disp: 15 g, Rfl: 1   oxyCODONE (OXY IR/ROXICODONE) 5 MG immediate release tablet, Take 1 tablet (5 mg total) by mouth every 6 (six) hours as needed., Disp: 10 tablet, Rfl: 0   predniSONE (DELTASONE) 20 MG tablet, 3 tabs poqday 1-2, 2 tabs poqday 3-4, 1 tab poqday 5-6, Disp: 12 tablet, Rfl: 0   rosuvastatin (CRESTOR) 20 MG tablet, Take 1 tablet (20 mg total) by mouth daily., Disp: 90 tablet, Rfl: 3  Allergies  Allergen Reactions   Codeine Nausea And Vomiting   Neomycin-Polymyxin B Gu Other (See Comments)    Causes eyes to swell, run and crust over.    Neomycin-Bacitracin Zn-Polymyx Hives and Rash    Objective:   BP 130/82   Pulse 76   Temp 97.6 F (36.4 C) (Oral)   Ht 5\' 3"  (1.6 m)   Wt 147 lb (66.7 kg)   SpO2 97%   BMI 26.04 kg/m      02/14/2023   11:09  AM 02/07/2023    2:35 PM 01/05/2023    1:08 PM  Vitals with BMI  Height 5\' 3"  5\' 3"  5' 3.5"  Weight 147 lbs 146 lbs 146 lbs 10 oz  BMI 26.05 25.87 25.56  Systolic 130 128 478  Diastolic 82 68 68  Pulse 76 79 77     Physical Exam Vitals and nursing note reviewed.  Constitutional:      Appearance: Normal appearance. She is normal weight.  HENT:     Head: Normocephalic and atraumatic.  Skin:    General: Skin is warm and dry.     Findings: Erythema and rash present. Rash is vesicular.       Neurological:     General: No focal deficit present.     Mental Status: She is alert and oriented to person, place, and time. Mental status is at baseline.  Psychiatric:        Mood and Affect: Mood normal.        Behavior: Behavior  normal.        Thought Content: Thought content normal.        Judgment: Judgment normal.     Assessment & Plan:  Dyshidrotic eczema Assessment & Plan: Patinet presents with vesicular and blistering rash to medial aspect of both palms as well as left chest wall. Not tolerating creams at this time due to pain. Will start Prednisone taper and follow with Elocon cream twice daily for 2-4 weeks. Return to office if symptoms persist or worsen.   Other orders -     predniSONE; 3 tabs poqday 1-2, 2 tabs poqday 3-4, 1 tab poqday 5-6  Dispense: 12 tablet; Refill: 0     Follow up plan: Return if symptoms worsen or fail to improve.  Park Meo, FNP

## 2023-02-14 NOTE — Telephone Encounter (Signed)
RN reached out to pt to see if she could come for a skin check with Dr. Basilio Cairo this afternoon. Pt stated she could come in at 1630 to see Dr. Basilio Cairo this afternoon.

## 2023-02-14 NOTE — Progress Notes (Signed)
Patient skipped RT today and was seen by Dr Doreen Salvage nurse with concerns re: skin.  I was notified and then asked for her to see me this PM.  Left breast is red, warm.  There is early moist desquamation at surgical scar of UOQ of left breast with surrounding dermatitis. Dermatitis scattered through breast and L SCV region as well. Hands have a scaly red rash (patient states this red rash on hands existed prior to radiation)  photos below:   UOQ L BREAST SCAR    L BREAST    HANDS:   Impression/Plan 2023-02-14:  The patient is tolerating radiation. Continue treatment as planned. Aquaphor and hydrocortisone cream recommended for skin of breast in case she is sensitive to other topicals.  I suspect she has very sensitive skin to radiation and possibly to topicals.  Will refer to dermatology (hand rash - unrelated to RT- will be treated for now with topical steroids from PCP and oral steroids).  I will notify nursing and Dr Dwain Sarna of her symptoms to see if she can be evaluated in his clinic to rule out infection - breast is more warm and  rash is more pronounced than typical for this point during RT. -----------------------------------  Lonie Peak, MD

## 2023-02-14 NOTE — Assessment & Plan Note (Signed)
Patinet presents with vesicular and blistering rash to medial aspect of both palms as well as left chest wall. Not tolerating creams at this time due to pain. Will start Prednisone taper and follow with Elocon cream twice daily for 2-4 weeks. Return to office if symptoms persist or worsen.

## 2023-02-14 NOTE — Telephone Encounter (Signed)
Pt came in for OV appt today to discuss, rash/blister

## 2023-02-15 ENCOUNTER — Other Ambulatory Visit: Payer: Self-pay

## 2023-02-15 ENCOUNTER — Ambulatory Visit
Admission: RE | Admit: 2023-02-15 | Discharge: 2023-02-15 | Disposition: A | Discharge status: Home / Self Care | Payer: MEDICAID | Source: Ambulatory Visit | Attending: Radiation Oncology | Admitting: Radiation Oncology

## 2023-02-15 ENCOUNTER — Telehealth: Payer: Self-pay

## 2023-02-15 DIAGNOSIS — Z510 Encounter for antineoplastic radiation therapy: Secondary | ICD-10-CM | POA: Diagnosis not present

## 2023-02-15 DIAGNOSIS — Z170 Estrogen receptor positive status [ER+]: Secondary | ICD-10-CM

## 2023-02-15 LAB — RAD ONC ARIA SESSION SUMMARY
Course Elapsed Days: 23
Plan Fractions Treated to Date: 17
Plan Fractions Treated to Date: 17
Plan Prescribed Dose Per Fraction: 2 Gy
Plan Prescribed Dose Per Fraction: 2 Gy
Plan Total Fractions Prescribed: 25
Plan Total Fractions Prescribed: 25
Plan Total Prescribed Dose: 50 Gy
Plan Total Prescribed Dose: 50 Gy
Reference Point Dosage Given to Date: 34 Gy
Reference Point Dosage Given to Date: 34 Gy
Reference Point Session Dosage Given: 2 Gy
Reference Point Session Dosage Given: 2 Gy
Session Number: 17

## 2023-02-15 NOTE — Telephone Encounter (Signed)
RN reached out to Dr. Doreen Salvage office to get her scheduled to see a provider in 2 days per Dr. Colletta Maryland request related to her skin concerns with her breast. Adelina Mings RN  called  Victorino Dike RN back and stated that Dr. Dwain Sarna wanted a response from Dr. Basilio Cairo directly regarding this matter. RN will forward this information to Dr. Basilio Cairo.

## 2023-02-15 NOTE — Progress Notes (Signed)
Dermatology referral placed per Dr. Colletta Maryland orders.

## 2023-02-16 ENCOUNTER — Ambulatory Visit
Admission: RE | Admit: 2023-02-16 | Discharge: 2023-02-16 | Disposition: A | Discharge status: Home / Self Care | Payer: MEDICAID | Source: Ambulatory Visit | Attending: Radiation Oncology | Admitting: Radiation Oncology

## 2023-02-16 ENCOUNTER — Other Ambulatory Visit: Payer: Self-pay

## 2023-02-16 DIAGNOSIS — Z510 Encounter for antineoplastic radiation therapy: Secondary | ICD-10-CM | POA: Diagnosis not present

## 2023-02-16 LAB — RAD ONC ARIA SESSION SUMMARY
Course Elapsed Days: 24
Plan Fractions Treated to Date: 18
Plan Fractions Treated to Date: 18
Plan Prescribed Dose Per Fraction: 2 Gy
Plan Prescribed Dose Per Fraction: 2 Gy
Plan Total Fractions Prescribed: 25
Plan Total Fractions Prescribed: 25
Plan Total Prescribed Dose: 50 Gy
Plan Total Prescribed Dose: 50 Gy
Reference Point Dosage Given to Date: 36 Gy
Reference Point Dosage Given to Date: 36 Gy
Reference Point Session Dosage Given: 2 Gy
Reference Point Session Dosage Given: 2 Gy
Session Number: 18

## 2023-02-19 ENCOUNTER — Other Ambulatory Visit: Payer: Self-pay

## 2023-02-19 ENCOUNTER — Ambulatory Visit
Admission: RE | Admit: 2023-02-19 | Discharge: 2023-02-19 | Disposition: A | Discharge status: Home / Self Care | Payer: MEDICAID | Source: Ambulatory Visit | Attending: Radiation Oncology

## 2023-02-19 ENCOUNTER — Ambulatory Visit: Payer: MEDICAID | Admitting: Radiation Oncology

## 2023-02-19 DIAGNOSIS — Z510 Encounter for antineoplastic radiation therapy: Secondary | ICD-10-CM | POA: Diagnosis not present

## 2023-02-19 LAB — RAD ONC ARIA SESSION SUMMARY
Course Elapsed Days: 27
Plan Fractions Treated to Date: 19
Plan Fractions Treated to Date: 19
Plan Prescribed Dose Per Fraction: 2 Gy
Plan Prescribed Dose Per Fraction: 2 Gy
Plan Total Fractions Prescribed: 25
Plan Total Fractions Prescribed: 25
Plan Total Prescribed Dose: 50 Gy
Plan Total Prescribed Dose: 50 Gy
Reference Point Dosage Given to Date: 38 Gy
Reference Point Dosage Given to Date: 38 Gy
Reference Point Session Dosage Given: 2 Gy
Reference Point Session Dosage Given: 2 Gy
Session Number: 19

## 2023-02-20 ENCOUNTER — Other Ambulatory Visit: Payer: Self-pay

## 2023-02-20 ENCOUNTER — Ambulatory Visit
Admission: RE | Admit: 2023-02-20 | Discharge: 2023-02-20 | Disposition: A | Discharge status: Home / Self Care | Payer: MEDICAID | Source: Ambulatory Visit | Attending: Radiation Oncology | Admitting: Radiation Oncology

## 2023-02-20 DIAGNOSIS — Z510 Encounter for antineoplastic radiation therapy: Secondary | ICD-10-CM | POA: Diagnosis not present

## 2023-02-20 LAB — RAD ONC ARIA SESSION SUMMARY
Course Elapsed Days: 28
Plan Fractions Treated to Date: 20
Plan Fractions Treated to Date: 20
Plan Prescribed Dose Per Fraction: 2 Gy
Plan Prescribed Dose Per Fraction: 2 Gy
Plan Total Fractions Prescribed: 25
Plan Total Fractions Prescribed: 25
Plan Total Prescribed Dose: 50 Gy
Plan Total Prescribed Dose: 50 Gy
Reference Point Dosage Given to Date: 40 Gy
Reference Point Dosage Given to Date: 40 Gy
Reference Point Session Dosage Given: 2 Gy
Reference Point Session Dosage Given: 2 Gy
Session Number: 20

## 2023-02-21 ENCOUNTER — Other Ambulatory Visit: Payer: Self-pay

## 2023-02-21 ENCOUNTER — Ambulatory Visit
Admission: RE | Admit: 2023-02-21 | Discharge: 2023-02-21 | Disposition: A | Discharge status: Home / Self Care | Payer: MEDICAID | Source: Ambulatory Visit | Attending: Radiation Oncology | Admitting: Radiation Oncology

## 2023-02-21 DIAGNOSIS — Z510 Encounter for antineoplastic radiation therapy: Secondary | ICD-10-CM | POA: Diagnosis not present

## 2023-02-21 LAB — RAD ONC ARIA SESSION SUMMARY
Course Elapsed Days: 29
Plan Fractions Treated to Date: 21
Plan Fractions Treated to Date: 21
Plan Prescribed Dose Per Fraction: 2 Gy
Plan Prescribed Dose Per Fraction: 2 Gy
Plan Total Fractions Prescribed: 25
Plan Total Fractions Prescribed: 25
Plan Total Prescribed Dose: 50 Gy
Plan Total Prescribed Dose: 50 Gy
Reference Point Dosage Given to Date: 42 Gy
Reference Point Dosage Given to Date: 42 Gy
Reference Point Session Dosage Given: 2 Gy
Reference Point Session Dosage Given: 2 Gy
Session Number: 21

## 2023-02-22 ENCOUNTER — Other Ambulatory Visit: Payer: Self-pay

## 2023-02-22 ENCOUNTER — Ambulatory Visit
Admission: RE | Admit: 2023-02-22 | Discharge: 2023-02-22 | Disposition: A | Discharge status: Home / Self Care | Payer: MEDICAID | Source: Ambulatory Visit | Attending: Radiation Oncology | Admitting: Radiation Oncology

## 2023-02-22 DIAGNOSIS — Z510 Encounter for antineoplastic radiation therapy: Secondary | ICD-10-CM | POA: Diagnosis not present

## 2023-02-22 LAB — RAD ONC ARIA SESSION SUMMARY
Course Elapsed Days: 30
Plan Fractions Treated to Date: 22
Plan Fractions Treated to Date: 22
Plan Prescribed Dose Per Fraction: 2 Gy
Plan Prescribed Dose Per Fraction: 2 Gy
Plan Total Fractions Prescribed: 25
Plan Total Fractions Prescribed: 25
Plan Total Prescribed Dose: 50 Gy
Plan Total Prescribed Dose: 50 Gy
Reference Point Dosage Given to Date: 44 Gy
Reference Point Dosage Given to Date: 44 Gy
Reference Point Session Dosage Given: 2 Gy
Reference Point Session Dosage Given: 2 Gy
Session Number: 22

## 2023-02-23 ENCOUNTER — Ambulatory Visit: Payer: MEDICAID

## 2023-02-23 ENCOUNTER — Ambulatory Visit
Admission: RE | Admit: 2023-02-23 | Discharge: 2023-02-23 | Disposition: A | Discharge status: Home / Self Care | Payer: MEDICAID | Source: Ambulatory Visit | Attending: Radiation Oncology | Admitting: Radiation Oncology

## 2023-02-23 ENCOUNTER — Other Ambulatory Visit: Payer: Self-pay

## 2023-02-23 DIAGNOSIS — Z510 Encounter for antineoplastic radiation therapy: Secondary | ICD-10-CM | POA: Diagnosis not present

## 2023-02-23 LAB — RAD ONC ARIA SESSION SUMMARY
Course Elapsed Days: 31
Plan Fractions Treated to Date: 23
Plan Fractions Treated to Date: 23
Plan Prescribed Dose Per Fraction: 2 Gy
Plan Prescribed Dose Per Fraction: 2 Gy
Plan Total Fractions Prescribed: 25
Plan Total Fractions Prescribed: 25
Plan Total Prescribed Dose: 50 Gy
Plan Total Prescribed Dose: 50 Gy
Reference Point Dosage Given to Date: 46 Gy
Reference Point Dosage Given to Date: 46 Gy
Reference Point Session Dosage Given: 2 Gy
Reference Point Session Dosage Given: 2 Gy
Session Number: 23

## 2023-02-26 ENCOUNTER — Other Ambulatory Visit: Payer: Self-pay | Admitting: Family Medicine

## 2023-02-26 ENCOUNTER — Other Ambulatory Visit: Payer: Self-pay

## 2023-02-26 ENCOUNTER — Ambulatory Visit
Admission: RE | Admit: 2023-02-26 | Discharge: 2023-02-26 | Disposition: A | Discharge status: Home / Self Care | Payer: MEDICAID | Source: Ambulatory Visit | Attending: Radiation Oncology

## 2023-02-26 ENCOUNTER — Ambulatory Visit: Payer: MEDICAID

## 2023-02-26 DIAGNOSIS — Z510 Encounter for antineoplastic radiation therapy: Secondary | ICD-10-CM | POA: Diagnosis not present

## 2023-02-26 LAB — RAD ONC ARIA SESSION SUMMARY
Course Elapsed Days: 34
Plan Fractions Treated to Date: 24
Plan Fractions Treated to Date: 24
Plan Prescribed Dose Per Fraction: 2 Gy
Plan Prescribed Dose Per Fraction: 2 Gy
Plan Total Fractions Prescribed: 25
Plan Total Fractions Prescribed: 25
Plan Total Prescribed Dose: 50 Gy
Plan Total Prescribed Dose: 50 Gy
Reference Point Dosage Given to Date: 48 Gy
Reference Point Dosage Given to Date: 48 Gy
Reference Point Session Dosage Given: 2 Gy
Reference Point Session Dosage Given: 2 Gy
Session Number: 24

## 2023-02-27 ENCOUNTER — Other Ambulatory Visit: Payer: Self-pay

## 2023-02-27 ENCOUNTER — Ambulatory Visit
Admission: RE | Admit: 2023-02-27 | Discharge: 2023-02-27 | Disposition: A | Discharge status: Home / Self Care | Payer: MEDICAID | Source: Ambulatory Visit | Attending: Radiation Oncology | Admitting: Radiation Oncology

## 2023-02-27 ENCOUNTER — Ambulatory Visit: Payer: MEDICAID

## 2023-02-27 DIAGNOSIS — Z510 Encounter for antineoplastic radiation therapy: Secondary | ICD-10-CM | POA: Diagnosis not present

## 2023-02-27 LAB — RAD ONC ARIA SESSION SUMMARY
Course Elapsed Days: 35
Plan Fractions Treated to Date: 25
Plan Fractions Treated to Date: 25
Plan Prescribed Dose Per Fraction: 2 Gy
Plan Prescribed Dose Per Fraction: 2 Gy
Plan Total Fractions Prescribed: 25
Plan Total Fractions Prescribed: 25
Plan Total Prescribed Dose: 50 Gy
Plan Total Prescribed Dose: 50 Gy
Reference Point Dosage Given to Date: 50 Gy
Reference Point Dosage Given to Date: 50 Gy
Reference Point Session Dosage Given: 2 Gy
Reference Point Session Dosage Given: 2 Gy
Session Number: 25

## 2023-02-28 ENCOUNTER — Telehealth: Payer: Self-pay | Admitting: Hematology and Oncology

## 2023-02-28 ENCOUNTER — Ambulatory Visit: Payer: MEDICAID

## 2023-02-28 NOTE — Radiation Completion Notes (Signed)
Patient Name: Bethany Mills, Bethany Mills MRN: [id number] Date of Birth: [birth year] Referring Physician: Kurtis Bushman, M.D. Date of Service: 2023-02-28 Radiation Oncologist: Lonie Peak, M.D. [facility]                             RADIATION ONCOLOGY END OF TREATMENT NOTE     Diagnosis: C50.412 Malignant neoplasm of upper-outer quadrant of left female breast Staging on 2022-11-22: Malignant neoplasm of upper-outer quadrant of left breast in female, estrogen receptor positive (HCC) T=cT1c, N=cN0, M=cM0 Intent: Curative     ==========DELIVERED PLANS==========  First Treatment Date: 2023-01-23 - Last Treatment Date: 2023-02-27   Plan Name: Breast_L_BH Site: Breast, Left Technique: 3D Mode: Photon Dose Per Fraction: 2 Gy Prescribed Dose (Delivered / Prescribed): 50 Gy / 50 Gy Prescribed Fxs (Delivered / Prescribed): 25 / 25   Plan Name: Brst_L_SCV_BH Site: Breast, Left Technique: 3D Mode: Photon Dose Per Fraction: 2 Gy Prescribed Dose (Delivered / Prescribed): 50 Gy / 50 Gy Prescribed Fxs (Delivered / Prescribed): 25 / 25     ==========ON TREATMENT VISIT DATES========== 2023-01-29, 2023-02-05, 2023-02-09, 2023-02-14, 2023-02-19, 2023-02-26     ==========UPCOMING VISITS==========       ==========APPENDIX - ON TREATMENT VISIT NOTES==========   See weekly On Treatment Notes in Epic for details.

## 2023-02-28 NOTE — Telephone Encounter (Signed)
Per staff message sent on 10/22 patient is aware of scheduled appointment times/dates

## 2023-03-01 ENCOUNTER — Ambulatory Visit: Payer: MEDICAID

## 2023-03-02 ENCOUNTER — Ambulatory Visit: Payer: MEDICAID

## 2023-03-02 ENCOUNTER — Ambulatory Visit: Payer: MEDICAID | Attending: General Surgery

## 2023-03-02 VITALS — Wt 140.2 lb

## 2023-03-02 DIAGNOSIS — Z483 Aftercare following surgery for neoplasm: Secondary | ICD-10-CM | POA: Insufficient documentation

## 2023-03-02 NOTE — Therapy (Signed)
OUTPATIENT PHYSICAL THERAPY SOZO SCREENING NOTE   Patient Name: Bethany Mills MRN: [id number] DOB:[birth year], 62 y.o., female Today's Date: 03/02/2023  PCP: Park Meo, FNP REFERRING PROVIDER: Emelia Loron, MD   PT End of Session - 03/02/23 1021     Visit Number 2   # unchanged due to screen only   PT Start Time 1014    PT Stop Time 1030    PT Time Calculation (min) 16 min    Activity Tolerance Patient tolerated treatment well    Behavior During Therapy WFL for tasks assessed/performed             Past Medical History:  Diagnosis Date   Allergies    Anxiety    COPD (chronic obstructive pulmonary disease) (HCC)    Hyperglycemic coma (HCC)    Hypertension    Past Surgical History:  Procedure Laterality Date   ABDOMINAL HYSTERECTOMY     BREAST BIOPSY Left 11/13/2022   [place] LT BREAST BX W LOC DEV 1ST LESION IMG BX SPEC US GUIDE 11/13/2022 [facility]   BREAST BIOPSY  12/05/2022   MM LT RADIOACTIVE SEED LOC MAMMO GUIDE 12/05/2022 [facility]   BREAST LUMPECTOMY WITH RADIOACTIVE SEED AND SENTINEL LYMPH NODE BIOPSY Left 12/06/2022   Procedure: LEFT BREAST SEED GUIDED LUMPECTOMY, LEFT AXILLARY SENTINEL NODE BIOPSY;  Surgeon: Emelia Loron, MD;  Location: [facility];  Service: General;  Laterality: Left;  PEC BLOCK   Patient Active Problem List   Diagnosis Date Noted   Dyshidrotic eczema 02/07/2023   Dermatitis 12/04/2022   Situational anxiety 11/21/2022   Malignant neoplasm of upper-outer quadrant of left breast in female, estrogen receptor positive (HCC) 11/20/2022   Physical exam, annual 10/11/2022   Cervical cancer screening 10/11/2022   Mixed hyperlipidemia 10/11/2022   Altered mental status 10/03/2022   Encounter to establish care with new doctor 10/03/2022   DDD (degenerative disc disease), cervical 02/21/2013   Tobacco dependence 02/21/2013   POST-TRAUMATIC HEADACHE UNSPECIFIED 03/08/2010   HEMATEMESIS 03/08/2010   BACK  PAIN, LUMBAR 03/08/2010   ABDOMINAL PAIN, EPIGASTRIC 03/08/2010   CERVICAL STRAIN 03/08/2010   RECTAL BLEEDING 12/20/2009   WEAKNESS 12/20/2009   PELVIC  PAIN 12/20/2009   History of colonic polyps 12/20/2009    REFERRING DIAG: left breast cancer at risk for lymphedema  THERAPY DIAG: Aftercare following surgery for neoplasm  PERTINENT HISTORY: Patient was diagnosed on 10/31/2022 with left grade 1 invasive ductal carcinoma breast cancer. It measures 1.2 cm and is located in the upper outer quadrant. It is ER/PR positive and HER2 negative with a Ki67 of 10%. She had a left lumpectomy with SLNB on8/30/2024 with 1+/1 LN. She does not want chemotherapy but will have radiation and Anastrozole.She smokes 1 pack per day.   PRECAUTIONS: left UE Lymphedema risk, None  SUBJECTIVE: Pt returns for her first 3 month L-Dex screen.   PAIN:  Are you having pain? No  SOZO SCREENING: Patient was assessed today using the SOZO machine to determine the lymphedema index score. This was compared to her baseline score. It was determined that she is within the recommended range when compared to her baseline and no further action is needed at this time. She will continue SOZO screenings. These are done every 3 months for 2 years post operatively followed by every 6 months for 2 years, and then annually.   L-DEX FLOWSHEETS - 03/02/23 1000       L-DEX LYMPHEDEMA SCREENING   Measurement Type Unilateral  L-DEX MEASUREMENT EXTREMITY Upper Extremity    POSITION  Standing    DOMINANT SIDE Right    At Risk Side Left    BASELINE SCORE (UNILATERAL) 3    L-DEX SCORE (UNILATERAL) 3.2    VALUE CHANGE (UNILAT) 0.2               Hermenia Bers, PTA 03/02/2023, 10:31 AM

## 2023-03-15 ENCOUNTER — Encounter: Payer: Self-pay | Admitting: Family Medicine

## 2023-03-15 ENCOUNTER — Ambulatory Visit (INDEPENDENT_AMBULATORY_CARE_PROVIDER_SITE_OTHER): Payer: MEDICAID | Admitting: Family Medicine

## 2023-03-15 VITALS — BP 120/62 | HR 91 | Temp 98.0°F | Ht 63.0 in | Wt 145.4 lb

## 2023-03-15 DIAGNOSIS — L301 Dyshidrosis [pompholyx]: Secondary | ICD-10-CM

## 2023-03-15 MED ORDER — MOMETASONE FUROATE 0.1 % EX CREA: TOPICAL_CREAM | CUTANEOUS | 1 refills | Status: DC

## 2023-03-15 NOTE — Progress Notes (Signed)
Subjective:    Patient ID: Bethany Mills, female    DOB: 11-04-1960, 62 y.o.   MRN: [id number]  HPI Patient has dry cracked skin on both hands.  She states that her hands are itching constantly.  She has been using cortisone cream multiple times a day without relief.  She recently took oral steroids which helped dramatically once she stopped the oral steroids the itching and blisters and dry cracked skin return.  There is no visible blisters today but the skin definitely appears thin and dry and irritated.  It is primarily the skin on the fingers in the hands.  It does not spread beyond the wrist. Past Medical History:  Diagnosis Date   Allergies    Anxiety    COPD (chronic obstructive pulmonary disease) (HCC)    Hyperglycemic coma (HCC)    Hypertension    Past Surgical History:  Procedure Laterality Date   ABDOMINAL HYSTERECTOMY     BREAST BIOPSY Left 11/13/2022   [place] LT BREAST BX W LOC DEV 1ST LESION IMG BX SPEC US GUIDE 11/13/2022 [facility]   BREAST BIOPSY  12/05/2022   MM LT RADIOACTIVE SEED LOC MAMMO GUIDE 12/05/2022 [facility]   BREAST LUMPECTOMY WITH RADIOACTIVE SEED AND SENTINEL LYMPH NODE BIOPSY Left 12/06/2022   Procedure: LEFT BREAST SEED GUIDED LUMPECTOMY, LEFT AXILLARY SENTINEL NODE BIOPSY;  Surgeon: Emelia Loron, MD;  Location: [facility];  Service: General;  Laterality: Left;  PEC BLOCK   Current Outpatient Medications on File Prior to Visit  Medication Sig Dispense Refill   ALPRAZolam (XANAX) 0.5 MG tablet TAKE 1-2 TABLETS (0.5-1 MG TOTAL) BY MOUTH 3 (THREE) TIMES DAILY AS NEEDED FOR ANXIETY. 90 tablet 0   amLODipine (NORVASC) 10 MG tablet TAKE 1 TABLET BY MOUTH EVERY DAY 90 tablet 2   aspirin 81 MG chewable tablet Chew 1 tablet (81 mg total) by mouth daily. 30 tablet 0   busPIRone (BUSPAR) 5 MG tablet Take 5 mg by mouth 2 (two) times daily.     rosuvastatin (CRESTOR) 20 MG tablet Take 1 tablet (20 mg total) by mouth daily. 90 tablet 3    clotrimazole-betamethasone (LOTRISONE) cream Apply 1 Application topically daily. (Patient not taking: Reported on 03/15/2023) 30 g 0   oxyCODONE (OXY IR/ROXICODONE) 5 MG immediate release tablet Take 1 tablet (5 mg total) by mouth every 6 (six) hours as needed. (Patient not taking: Reported on 03/15/2023) 10 tablet 0   No current facility-administered medications on file prior to visit.   Allergies  Allergen Reactions   Codeine Nausea And Vomiting   Neomycin-Polymyxin B Gu Other (See Comments)    Causes eyes to swell, run and crust over.    Neomycin-Bacitracin Zn-Polymyx Hives and Rash   Social History   Socioeconomic History   Marital status: Divorced    Spouse name: Not on file   Number of children: Not on file   Years of education: Not on file   Highest education level: Not on file  Occupational History   Not on file  Tobacco Use   Smoking status: Every Day    Current packs/day: 1.00    Average packs/day: 1 pack/day for 15.0 years (15.0 ttl pk-yrs)    Types: Cigarettes   Smokeless tobacco: Never  Substance and Sexual Activity   Alcohol use: Yes    Comment: beer-1-5 beers a day   Drug use: Yes    Types: Marijuana    Comment: yesterday last dose 12/05/22   Sexual  activity: Not Currently  Other Topics Concern   Not on file  Social History Narrative   Not on file   Social Determinants of Health   Financial Resource Strain: Not on file  Food Insecurity: No Food Insecurity (11/23/2022)   Hunger Vital Sign    Worried About Running Out of Food in the Last Year: Never true    Ran Out of Food in the Last Year: Never true  Transportation Needs: No Transportation Needs (11/23/2022)   PRAPARE - Administrator, Civil Service (Medical): No    Lack of Transportation (Non-Medical): No  Physical Activity: Not on file  Stress: Not on file  Social Connections: Not on file  Intimate Partner Violence: Not At Risk (11/23/2022)   Humiliation, Afraid, Rape, and Kick  questionnaire    Fear of Current or Ex-Partner: No    Emotionally Abused: No    Physically Abused: No    Sexually Abused: No      Review of Systems  All other systems reviewed and are negative.      Objective:   Physical Exam Vitals reviewed.  Constitutional:      Appearance: Normal appearance.  Cardiovascular:     Rate and Rhythm: Normal rate.  Skin:    Coloration: Skin is not jaundiced.     Findings: Rash present. No bruising or erythema. Rash is vesicular.       Neurological:     Mental Status: She is alert.           Assessment & Plan:  Dyshidrotic eczema Recommended Elocon cream to be applied to the skin at night.  Recommended that she put on a pair of gloves to help "marinate" the skin and the steroid cream while she sleeps.  Recommended that she apply Vaseline 2-3 times a day to the hands as a moisturizer.  Recommended that she stop washing her hands frequently during the day as she has been doing as this only exacerbates the situation.  Recommended that she avoid soap such as Dawn.  Reassess in 2 weeks

## 2023-03-19 ENCOUNTER — Ambulatory Visit: Payer: MEDICAID | Admitting: Family Medicine

## 2023-03-19 ENCOUNTER — Ambulatory Visit: Payer: Self-pay | Admitting: Family Medicine

## 2023-03-19 VITALS — BP 120/70 | HR 94 | Temp 98.2°F | Ht 63.0 in | Wt 143.0 lb

## 2023-03-19 DIAGNOSIS — L301 Dyshidrosis [pompholyx]: Secondary | ICD-10-CM

## 2023-03-19 MED ORDER — TRIAMCINOLONE ACETONIDE 0.1 % EX CREA: TOPICAL_CREAM | Freq: Two times a day (BID) | CUTANEOUS | 1 refills | Status: DC

## 2023-03-19 NOTE — Telephone Encounter (Signed)
Chief Complaint: rash Symptoms: rash on both hands and itching after using Memetasone cream Pertinent Negatives: Patient denies fever, difficulty breathing, rash spreading Disposition: [] ED /[] Urgent Care (no appt availability in office) / [x] Appointment(In office/virtual)/ []  [facility]/ [] Home Care/ [] Refused Recommended Disposition /[] [facility]/ []  Follow-up with PCP Additional Notes: Patient reports she was prescribed Memetasone cream last week. Patient reports she used it on her hands once since being prescribed and has had itching and red bumps on both hands ever since. Patient reports the itching is not being relieved with OTC products. Patient reports that last time this happened she needed to be prescribed steroid pill. Patient is scheduled for appointment this afternoon 11/11.      Copied from CRM [id number]. Topic: Clinical - Pink Word Triage >> Mar 19, 2023  9:29 AM Roswell Nickel wrote: Reason for Triage allergic reaction to medication. Reason for Disposition  [1] Taking new prescription medication AND [2] rash within 4 hours of 1st dose  Answer Assessment - Initial Assessment Questions 1. APPEARANCE: "What does the rash look like?"      Red raised bumps 2. LOCATION: "Where is the rash located?"      On both hands 3. NUMBER: "How many hives are there?"      "Multiple" 4. SIZE: "How big are the hives?" (inches, cm, compare to coins) "Do they all look the same or is there lots of variation in shape and size?"     Smaller than a penny 5. ONSET: "When did the hives begin?" (Hours or days ago)      "A few days ago" 6. ITCHING: "Does it itch?" If Yes, ask: "How bad is the itch?"    - MILD: doesn't interfere with normal activities   - MODERATE-SEVERE: interferes with work, school, sleep, or other activities      severe 7. RECURRENT PROBLEM: "Have you had hives before?" If Yes, ask: "When was the last time?" and "What happened that time?"      "I've had  something similar before and needed steroid pills" 8. TRIGGERS: "Were you exposed to any new food, plant, cosmetic product or animal just before the hives began?"     I used Memetasone cream once and it triggered it 9. OTHER SYMPTOMS: "Do you have any other symptoms?" (e.g., fever, tongue swelling, difficulty breathing, abdomen pain)     no  Answer Assessment - Initial Assessment Questions 1. APPEARANCE of RASH: "Describe the rash." (e.g., spots, blisters, raised areas, skin peeling, scaly)     Red raised bumps 2. SIZE: "How big are the spots?" (e.g., tip of pen, eraser, coin; inches, centimeters)     Smaller than a penny 3. LOCATION: "Where is the rash located?"     Both hands 4. COLOR: "What color is the rash?" (Note: It is difficult to assess rash color in people with darker-colored skin. When this situation occurs, simply ask the caller to describe what they see.)     red 5. ONSET: "When did the rash begin?"     A few days ago 6. FEVER: "Do you have a fever?" If Yes, ask: "What is your temperature, how was it measured, and when did it start?"     no 7. ITCHING: "Does the rash itch?" If Yes, ask: "How bad is the itch?" (Scale 1-10; or mild, moderate, severe)     severe 8. CAUSE: "What do you think is causing the rash?"     I used memetasone cream once and  it's been itching ever since 9. NEW MEDICINES: "What new medicines are you taking?" (e.g., name of antibiotic) "When did you start taking this medication?".     Memetasone cream 10. OTHER SYMPTOMS: "Do you have any other symptoms?" (e.g., sore throat, fever, joint pain)       no  Protocols used: Hives-A-AH, Rash - Widespread On Drugs-A-AH

## 2023-03-19 NOTE — Progress Notes (Signed)
Subjective:    Patient ID: Bethany Mills, female    DOB: 12/10/1960, 62 y.o.   MRN: [id number]  Rash  03/15/23 Patient has dry cracked skin on both hands.  She states that her hands are itching constantly.  She has been using cortisone cream multiple times a day without relief.  She recently took oral steroids which helped dramatically once she stopped the oral steroids the itching and blisters and dry cracked skin return.  There is no visible blisters today but the skin definitely appears thin and dry and irritated.  It is primarily the skin on the fingers in the hands.  It does not spread beyond the wrist.  At that time, my plan was: Recommended Elocon cream to be applied to the skin at night.  Recommended that she put on a pair of gloves to help "marinate" the skin and the steroid cream while she sleeps.  Recommended that she apply Vaseline 2-3 times a day to the hands as a moisturizer.  Recommended that she stop washing her hands frequently during the day as she has been doing as this only exacerbates the situation.  Recommended that she avoid soap such as Dawn.  Reassess in 2 weeks  03/19/23 Patient try the Elocon cream once.  She states that it "set her hands on fire".  she states that her hand burns so bad she was crying.  Therefore she is back today requesting treatment options for her dyshidrotic eczema.  She is very frustrated by this.  However, patient's prednisone to help.  She tried Elocon 1 time which caused her pain.  She denies trying any other medication.  My partner has prescribed a few creams but she does not recall their names and she did not try them.  She did try over-the-counter cortisone cream with no success. Past Medical History:  Diagnosis Date   Allergies    Anxiety    COPD (chronic obstructive pulmonary disease) (HCC)    Hyperglycemic coma (HCC)    Hypertension    Past Surgical History:  Procedure Laterality Date   ABDOMINAL HYSTERECTOMY     BREAST BIOPSY Left  11/13/2022   [place] LT BREAST BX W LOC DEV 1ST LESION IMG BX SPEC US GUIDE 11/13/2022 [facility]   BREAST BIOPSY  12/05/2022   MM LT RADIOACTIVE SEED LOC MAMMO GUIDE 12/05/2022 [facility]   BREAST LUMPECTOMY WITH RADIOACTIVE SEED AND SENTINEL LYMPH NODE BIOPSY Left 12/06/2022   Procedure: LEFT BREAST SEED GUIDED LUMPECTOMY, LEFT AXILLARY SENTINEL NODE BIOPSY;  Surgeon: Emelia Loron, MD;  Location: [facility];  Service: General;  Laterality: Left;  PEC BLOCK   Current Outpatient Medications on File Prior to Visit  Medication Sig Dispense Refill   ALPRAZolam (XANAX) 0.5 MG tablet TAKE 1-2 TABLETS (0.5-1 MG TOTAL) BY MOUTH 3 (THREE) TIMES DAILY AS NEEDED FOR ANXIETY. 90 tablet 0   amLODipine (NORVASC) 10 MG tablet TAKE 1 TABLET BY MOUTH EVERY DAY 90 tablet 2   aspirin 81 MG chewable tablet Chew 1 tablet (81 mg total) by mouth daily. 30 tablet 0   busPIRone (BUSPAR) 5 MG tablet Take 5 mg by mouth 2 (two) times daily.     clotrimazole-betamethasone (LOTRISONE) cream Apply 1 Application topically daily. 30 g 0   oxyCODONE (OXY IR/ROXICODONE) 5 MG immediate release tablet Take 1 tablet (5 mg total) by mouth every 6 (six) hours as needed. 10 tablet 0   rosuvastatin (CRESTOR) 20 MG tablet Take 1 tablet (20 mg  total) by mouth daily. 90 tablet 3   mometasone (ELOCON) 0.1 % cream Apply to hands twice daily for up to 4 weeks (Patient not taking: Reported on 03/19/2023) 15 g 1   No current facility-administered medications on file prior to visit.   Allergies  Allergen Reactions   Codeine Nausea And Vomiting   Neomycin-Polymyxin B Gu Other (See Comments)    Causes eyes to swell, run and crust over.    Neomycin-Bacitracin Zn-Polymyx Hives and Rash   Social History   Socioeconomic History   Marital status: Divorced    Spouse name: Not on file   Number of children: Not on file   Years of education: Not on file   Highest education level: Not on file  Occupational History    Not on file  Tobacco Use   Smoking status: Every Day    Current packs/day: 1.00    Average packs/day: 1 pack/day for 15.0 years (15.0 ttl pk-yrs)    Types: Cigarettes   Smokeless tobacco: Never  Substance and Sexual Activity   Alcohol use: Yes    Comment: beer-1-5 beers a day   Drug use: Yes    Types: Marijuana    Comment: yesterday last dose 12/05/22   Sexual activity: Not Currently  Other Topics Concern   Not on file  Social History Narrative   Not on file   Social Determinants of Health   Financial Resource Strain: Not on file  Food Insecurity: No Food Insecurity (11/23/2022)   Hunger Vital Sign    Worried About Running Out of Food in the Last Year: Never true    Ran Out of Food in the Last Year: Never true  Transportation Needs: No Transportation Needs (11/23/2022)   PRAPARE - Administrator, Civil Service (Medical): No    Lack of Transportation (Non-Medical): No  Physical Activity: Not on file  Stress: Not on file  Social Connections: Not on file  Intimate Partner Violence: Not At Risk (11/23/2022)   Humiliation, Afraid, Rape, and Kick questionnaire    Fear of Current or Ex-Partner: No    Emotionally Abused: No    Physically Abused: No    Sexually Abused: No      Review of Systems  Skin:  Positive for rash.  All other systems reviewed and are negative.      Objective:   Physical Exam Vitals reviewed.  Constitutional:      Appearance: Normal appearance.  Cardiovascular:     Rate and Rhythm: Normal rate.  Skin:    Coloration: Skin is not jaundiced.     Findings: Rash present. No bruising or erythema. Rash is vesicular.       Neurological:     Mental Status: She is alert.           Assessment & Plan:   Dyshidrotic eczema Recommended applying triamcinolone/Eucerin cream at a one-to-one ratio to the skin at night.  Recommended that she put on a pair of gloves to help "marinate" the skin and the steroid cream while she sleeps.   Recommended that she apply Vaseline 2-3 times a day to the hands as a moisturizer.  Recommended that she stop washing her hands frequently during the day as she has been doing as this only exacerbates the situation.  Recommended that she avoid soap such as Dawn.  Reassess in 2 weeks.  Apologized for the reaction she had to [employer]

## 2023-03-20 ENCOUNTER — Inpatient Hospital Stay: Payer: MEDICAID | Attending: Hematology and Oncology | Admitting: Hematology and Oncology

## 2023-03-20 VITALS — BP 145/70 | HR 81 | Temp 97.3°F | Resp 18 | Ht 63.0 in | Wt 143.3 lb

## 2023-03-20 DIAGNOSIS — Z79811 Long term (current) use of aromatase inhibitors: Secondary | ICD-10-CM | POA: Diagnosis not present

## 2023-03-20 DIAGNOSIS — C50412 Malignant neoplasm of upper-outer quadrant of left female breast: Secondary | ICD-10-CM | POA: Insufficient documentation

## 2023-03-20 DIAGNOSIS — Z923 Personal history of irradiation: Secondary | ICD-10-CM | POA: Insufficient documentation

## 2023-03-20 DIAGNOSIS — Z170 Estrogen receptor positive status [ER+]: Secondary | ICD-10-CM | POA: Diagnosis not present

## 2023-03-20 DIAGNOSIS — Z79899 Other long term (current) drug therapy: Secondary | ICD-10-CM | POA: Insufficient documentation

## 2023-03-20 DIAGNOSIS — L309 Dermatitis, unspecified: Secondary | ICD-10-CM | POA: Insufficient documentation

## 2023-03-20 MED ORDER — ANASTROZOLE 1 MG PO TABS: 1.0000 mg | ORAL_TABLET | Freq: Every day | ORAL | 3 refills | Status: DC

## 2023-03-20 NOTE — Progress Notes (Signed)
Patient Care Team: Park Meo, FNP as PCP - General (Family Medicine) Pershing Proud, RN as Oncology Nurse Navigator Donnelly Angelica, RN as Oncology Nurse Navigator Emelia Loron, MD as Consulting Physician (General Surgery) Lonie Peak, MD as Consulting Physician (Radiation Oncology) Serena Croissant, MD as Consulting Physician (Hematology and Oncology)  DIAGNOSIS:  Encounter Diagnosis  Name Primary?   Malignant neoplasm of upper-outer quadrant of left breast in female, estrogen receptor positive (HCC) Yes    SUMMARY OF ONCOLOGIC HISTORY: Oncology History  Malignant neoplasm of upper-outer quadrant of left breast in female, estrogen receptor positive (HCC)  11/13/2022 Initial Diagnosis   Screening mammogram detected left breast mass and distortion at 12 o'clock position measuring 1.2 cm, axilla negative, biopsy revealed grade 1 IDC with DCIS ER 100%, PR 85%, Ki67 10%, HER2 negative 1+   11/22/2022 Cancer Staging   Staging form: Breast, AJCC 8th Edition - Clinical stage from 11/22/2022: Stage IA (cT1c, cN0, cM0, G1, ER+, PR+, HER2-) - Signed by Serena Croissant, MD on 11/22/2022 Stage prefix: Initial diagnosis Histologic grading system: 3 grade system Laterality: Left Staged by: Pathologist and managing physician National guidelines used in treatment planning: Yes Type of national guideline used in treatment planning: NCCN   12/06/2022 Surgery   Left lumpectomy: Grade 2 IDC 1.1 cm, margins negative, 1/1 lymph node positive with extranodal extension ER 100%, PR 85%, HER2 1+ negative, Ki-67 10%   01/24/2023 - 02/27/2023 Radiation Therapy   Adjuvant radiation   03/20/2023 -  Anti-estrogen oral therapy   Anastrozole   03/20/2023 Cancer Staging   Staging form: Breast, AJCC 8th Edition - Pathologic: Stage IA (pT1c, pN1, cM0, G2, ER+, PR+, HER2-) - Signed by Serena Croissant, MD on 03/20/2023 Histologic grading system: 3 grade system     CHIEF COMPLIANT: Follow-up after  radiation therapy is completed  HISTORY OF PRESENT ILLNESS:  History of Present Illness   The patient, with a history of estrogen receptor positive breast cancer, has recently completed radiation therapy and surgery. She reports fatigue, which she attributes to the radiation therapy. She also reports severe eczema on the hands, which is being managed with creams. The patient has been advised to start anastrozole, an anti-estrogen medication, to prevent recurrence of the cancer. She has concerns about potential side effects, including hot flashes and joint stiffness. She has been advised to start the medication on December 1st, after a period of recovery from the radiation therapy.         ALLERGIES:  is allergic to codeine, neomycin-polymyxin b gu, and neomycin-bacitracin zn-polymyx.  MEDICATIONS:  Current Outpatient Medications  Medication Sig Dispense Refill   [START ON 04/06/2023] anastrozole (ARIMIDEX) 1 MG tablet Take 1 tablet (1 mg total) by mouth daily. 90 tablet 3   ALPRAZolam (XANAX) 0.5 MG tablet TAKE 1-2 TABLETS (0.5-1 MG TOTAL) BY MOUTH 3 (THREE) TIMES DAILY AS NEEDED FOR ANXIETY. 90 tablet 0   amLODipine (NORVASC) 10 MG tablet TAKE 1 TABLET BY MOUTH EVERY DAY 90 tablet 2   aspirin 81 MG chewable tablet Chew 1 tablet (81 mg total) by mouth daily. 30 tablet 0   busPIRone (BUSPAR) 5 MG tablet Take 5 mg by mouth 2 (two) times daily.     clotrimazole-betamethasone (LOTRISONE) cream Apply 1 Application topically daily. 30 g 0   mometasone (ELOCON) 0.1 % cream Apply to hands twice daily for up to 4 weeks (Patient not taking: Reported on 03/19/2023) 15 g 1   oxyCODONE (OXY IR/ROXICODONE) 5 MG immediate  release tablet Take 1 tablet (5 mg total) by mouth every 6 (six) hours as needed. 10 tablet 0   rosuvastatin (CRESTOR) 20 MG tablet Take 1 tablet (20 mg total) by mouth daily. 90 tablet 3   triamcinolone 0.1%-Eucerin equivalent 1:1 cream mixture Apply topically 2 (two) times daily. 480 g 1    No current facility-administered medications for this visit.    PHYSICAL EXAMINATION: ECOG PERFORMANCE STATUS: 1 - Symptomatic but completely ambulatory  Vitals:   03/20/23 1135  BP: (!) 145/70  Pulse: 81  Resp: 18  Temp: (!) 97.3 F (36.3 C)  SpO2: 95%   Filed Weights   03/20/23 1135  Weight: 143 lb 4.8 oz (65 kg)    Physical Exam   SKIN: Severe eczema with dryness and blisters.      (exam performed in the presence of a chaperone)  LABORATORY DATA:  I have reviewed the data as listed    Latest Ref Rng & Units 11/22/2022   12:21 PM 11/06/2022    8:53 AM 10/04/2022    8:13 AM  CMP  Glucose 70 - 99 mg/dL 413  244  010   BUN 8 - 23 mg/dL 9  14  12    Creatinine 0.44 - 1.00 mg/dL 2.72  5.36  6.44   Sodium 135 - 145 mmol/L 140  143  141   Potassium 3.5 - 5.1 mmol/L 3.4  4.9  4.2   Chloride 98 - 111 mmol/L 103  105  102   CO2 22 - 32 mmol/L 25  30  28    Calcium 8.9 - 10.3 mg/dL 9.8  9.6  9.6   Total Protein 6.5 - 8.1 g/dL 8.1  7.5  7.4   Total Bilirubin 0.3 - 1.2 mg/dL 0.4  0.2  0.4   Alkaline Phos 38 - 126 U/L 114     AST 15 - 41 U/L 26  18  23    ALT 0 - 44 U/L 29  24  37     Lab Results  Component Value Date   WBC 8.7 11/22/2022   HGB 13.2 11/22/2022   HCT 38.9 11/22/2022   MCV 87.4 11/22/2022   PLT 304 11/22/2022   NEUTROABS 5.5 11/22/2022    ASSESSMENT & PLAN:  Malignant neoplasm of upper-outer quadrant of left breast in female, estrogen receptor positive (HCC) 12/06/2022: Left lumpectomy: Grade 2 IDC 1.1 cm, margins negative, 1/1 lymph node positive with extranodal extension ER 100%, PR 85%, HER2 1+ negative, Ki-67 10%   Treatment plan: Will not performing Oncotype DX testing because patient does not have any interest in chemo. Adjuvant radiation 01/24/2023-02/27/2023 Adjuvant antiestrogen therapy with anastrozole 1 mg daily x 5 years started 03/20/2023   Anastrozole counseling: We discussed the risks and benefits of anti-estrogen therapy with aromatase  inhibitors. These include but not limited to insomnia, hot flashes, mood changes, vaginal dryness, bone density loss, and weight gain. We strongly believe that the benefits far outweigh the risks. Patient understands these risks and consented to starting treatment. Planned treatment duration is 7 years.   Return to clinic in 3 months for survivorship care plan visit       Estrogen Receptor Positive Breast Cancer Completed surgery and radiation therapy. Discussed the benefits of adjuvant Anastrozole therapy to reduce the risk of recurrence. Potential side effects discussed include hot flashes, joint stiffness, and potential bone density loss. -Start Anastrozole 1mg  daily from December 1st. -Encourage daily intake of Vitamin D 1000 IU and regular exercise to  maintain bone health. -Plan to monitor bone density every two years.  Eczema Severe eczema noted on hands. -Continue current treatment as prescribed by dermatologist.  Follow-up -Return visit in three months to assess tolerance and side effects of Anastrozole. -Schedule mammogram at next visit. -Subsequent follow-up in six months and then annually.          No orders of the defined types were placed in this encounter.  The patient has a good understanding of the overall plan. she agrees with it. she will call with any problems that may develop before the next visit here. Total time spent: 30 mins including face to face time and time spent for planning, charting and co-ordination of care   Tamsen Meek, MD 03/20/23

## 2023-03-20 NOTE — Assessment & Plan Note (Signed)
12/06/2022: Left lumpectomy: Grade 2 IDC 1.1 cm, margins negative, 1/1 lymph node positive with extranodal extension ER 100%, PR 85%, HER2 1+ negative, Ki-67 10%   Treatment plan: Will not performing Oncotype DX testing because patient does not have any interest in chemo. Adjuvant radiation 01/24/2023-02/27/2023 Adjuvant antiestrogen therapy with anastrozole 1 mg daily x 5 years started 03/20/2023   Anastrozole counseling: We discussed the risks and benefits of anti-estrogen therapy with aromatase inhibitors. These include but not limited to insomnia, hot flashes, mood changes, vaginal dryness, bone density loss, and weight gain. We strongly believe that the benefits far outweigh the risks. Patient understands these risks and consented to starting treatment. Planned treatment duration is 7 years.  We also discussed the pros and cons of adding CDK inhibitors to anastrozole therapy. Return to clinic in 3 months for survivorship care plan visit

## 2023-03-23 ENCOUNTER — Other Ambulatory Visit: Payer: Self-pay

## 2023-03-23 ENCOUNTER — Telehealth: Payer: Self-pay

## 2023-03-23 DIAGNOSIS — L301 Dyshidrosis [pompholyx]: Secondary | ICD-10-CM

## 2023-03-23 DIAGNOSIS — L309 Dermatitis, unspecified: Secondary | ICD-10-CM

## 2023-03-23 NOTE — Telephone Encounter (Signed)
I know this is Amber's pt but you have seen her the last 2 times. Is it okay to send over a referral? Thank you.   Copied from CRM [id number]. Topic: Clinical - Medical Advice >> Mar 23, 2023  9:04 AM Theodis Sato wrote: Reason for CRM: PT is requesting a referral to the dermatologist as she has had a rash on her hands (not an allergic reaction) but it is now spreading to her lower back-back of arms and some on her chest

## 2023-03-26 ENCOUNTER — Ambulatory Visit: Payer: Self-pay | Admitting: Family Medicine

## 2023-03-26 ENCOUNTER — Ambulatory Visit: Payer: MEDICAID | Admitting: Family Medicine

## 2023-03-26 ENCOUNTER — Encounter: Payer: Self-pay | Admitting: Family Medicine

## 2023-03-26 VITALS — BP 130/78 | HR 106 | Temp 97.9°F | Ht 63.0 in | Wt 141.0 lb

## 2023-03-26 DIAGNOSIS — L301 Dyshidrosis [pompholyx]: Secondary | ICD-10-CM | POA: Diagnosis not present

## 2023-03-26 MED ORDER — PREDNISONE 20 MG PO TABS: ORAL_TABLET | ORAL | 0 refills | Status: DC

## 2023-03-26 NOTE — Progress Notes (Signed)
Subjective:  HPI: Bethany Mills is a 62 y.o. female presenting on 03/26/2023 for Follow-up (Acute Hand Swelling)   HPI Patient is in today for uncontrolled eczema. Bethany Mills has been seen for dyshidrotic eczema to her bilateral hands several times and has been prescribed several different ointments and steroid creams. The rash is spreading and becoming much more generalized, it does itch. She is unable to tolerate steroid creams other than cortizone 10 due to burning of her hands.. She has tried vaseline once with gloves. In addition she does have 2 referrals pending for dermatology. Bethany Mills is not taking any new medications and reports no new exposures, detergents, soaps. She is limiting bathing and washing hands as much as possible and using pH neutral soaps;  Review of Systems  All other systems reviewed and are negative.   Relevant past medical history reviewed and updated as indicated.   Past Medical History:  Diagnosis Date   Allergies    Anxiety    COPD (chronic obstructive pulmonary disease) (HCC)    Hyperglycemic coma (HCC)    Hypertension      Past Surgical History:  Procedure Laterality Date   ABDOMINAL HYSTERECTOMY     BREAST BIOPSY Left 11/13/2022   [place] LT BREAST BX W LOC DEV 1ST LESION IMG BX SPEC US GUIDE 11/13/2022 [facility]   BREAST BIOPSY  12/05/2022   MM LT RADIOACTIVE SEED LOC MAMMO GUIDE 12/05/2022 [facility]   BREAST LUMPECTOMY WITH RADIOACTIVE SEED AND SENTINEL LYMPH NODE BIOPSY Left 12/06/2022   Procedure: LEFT BREAST SEED GUIDED LUMPECTOMY, LEFT AXILLARY SENTINEL NODE BIOPSY;  Surgeon: Emelia Loron, MD;  Location: [facility];  Service: General;  Laterality: Left;  PEC BLOCK    Allergies and medications reviewed and updated.   Current Outpatient Medications:    ALPRAZolam (XANAX) 0.5 MG tablet, TAKE 1-2 TABLETS (0.5-1 MG TOTAL) BY MOUTH 3 (THREE) TIMES DAILY AS NEEDED FOR ANXIETY., Disp: 90 tablet, Rfl: 0    amLODipine (NORVASC) 10 MG tablet, TAKE 1 TABLET BY MOUTH EVERY DAY, Disp: 90 tablet, Rfl: 2   [START ON 04/06/2023] anastrozole (ARIMIDEX) 1 MG tablet, Take 1 tablet (1 mg total) by mouth daily., Disp: 90 tablet, Rfl: 3   aspirin 81 MG chewable tablet, Chew 1 tablet (81 mg total) by mouth daily., Disp: 30 tablet, Rfl: 0   busPIRone (BUSPAR) 5 MG tablet, Take 5 mg by mouth 2 (two) times daily., Disp: , Rfl:    clotrimazole-betamethasone (LOTRISONE) cream, Apply 1 Application topically daily., Disp: 30 g, Rfl: 0   mometasone (ELOCON) 0.1 % cream, Apply to hands twice daily for up to 4 weeks, Disp: 15 g, Rfl: 1   oxyCODONE (OXY IR/ROXICODONE) 5 MG immediate release tablet, Take 1 tablet (5 mg total) by mouth every 6 (six) hours as needed., Disp: 10 tablet, Rfl: 0   predniSONE (DELTASONE) 20 MG tablet, 3 tabs poqday 1-2, 2 tabs poqday 3-4, 1 tab poqday 5-6, Disp: 12 tablet, Rfl: 0   rosuvastatin (CRESTOR) 20 MG tablet, Take 1 tablet (20 mg total) by mouth daily., Disp: 90 tablet, Rfl: 3   triamcinolone 0.1%-Eucerin equivalent 1:1 cream mixture, Apply topically 2 (two) times daily., Disp: 480 g, Rfl: 1  Allergies  Allergen Reactions   Codeine Nausea And Vomiting   Neomycin-Polymyxin B Gu Other (See Comments)    Causes eyes to swell, run and crust over.    Neomycin-Bacitracin Zn-Polymyx Hives and Rash    Objective:   BP 130/78  Pulse (!) 106   Temp 97.9 F (36.6 C) (Oral)   Ht 5\' 3"  (1.6 m)   Wt 141 lb (64 kg)   SpO2 90%   BMI 24.98 kg/m      03/26/2023   11:07 AM 03/20/2023   11:35 AM 03/19/2023    3:54 PM  Vitals with BMI  Height 5\' 3"  5\' 3"  5\' 3"   Weight 141 lbs 143 lbs 5 oz 143 lbs  BMI 24.98 25.39 25.34  Systolic 130 145 086  Diastolic 78 70 70  Pulse 106 81 94     Physical Exam Vitals and nursing note reviewed.  Constitutional:      Appearance: Normal appearance. She is normal weight.  HENT:     Head: Normocephalic and atraumatic.  Skin:    General: Skin is warm  and dry.     Findings: Rash present.     Comments: Severely dry, cracked skin to both hands with eczematous rash throughout all 4 extremities and torso.  Neurological:     General: No focal deficit present.     Mental Status: She is alert and oriented to person, place, and time. Mental status is at baseline.  Psychiatric:        Mood and Affect: Mood normal.        Behavior: Behavior normal.        Thought Content: Thought content normal.        Judgment: Judgment normal.     Assessment & Plan:  Dyshidrotic eczema Assessment & Plan: Reinforced treatment plan and importance of creams with Bethany Mills today. I will reorder oral Prednisone taper. Encouraged to use Eucerin ointment to both hands under gloves overnight and as tolerated during the day. Continue to avoid soaking hands or using harsh soaps and detergents. We have followed up on her referral to Dermatology and are working on helping her get this scheduled. Return to office if symptoms persist or worsen.    Other orders -     predniSONE; 3 tabs poqday 1-2, 2 tabs poqday 3-4, 1 tab poqday 5-6  Dispense: 12 tablet; Refill: 0     Follow up plan: Return if symptoms worsen or fail to improve.  Park Meo, FNP

## 2023-03-26 NOTE — Assessment & Plan Note (Signed)
Reinforced treatment plan and importance of creams with Ms Mossholder today. I will reorder oral Prednisone taper. Encouraged to use Eucerin ointment to both hands under gloves overnight and as tolerated during the day. Continue to avoid soaking hands or using harsh soaps and detergents. We have followed up on her referral to Dermatology and are working on helping her get this scheduled. Return to office if symptoms persist or worsen.

## 2023-03-26 NOTE — Telephone Encounter (Signed)
Copied from CRM [id number]. Topic: Clinical - Red Word Triage >> Mar 26, 2023  9:53 AM Lorin Glass B wrote: Red Word that prompted transfer to Nurse Triage: Possible Allergic reaction, swelling. Patient states her hands are swollen and cracked, and hives are all over body. Says symptoms began Friday and are now severe.   Chief Complaint: Bilateral Hand Swelling and Hives Symptoms: Erythemic, Draining, Cracked, Hands with marked Edema.  Frequency: Acute Pertinent Negatives: Patient denies Dyspnea, Chest Pain, Fever, or active Pruritus.  Disposition: [] ED /[] Urgent Care (no appt availability in office) / [x] Appointment(In office/virtual)/ []  [facility] [place] [facility]/ [] Home Care/ [] Refused Recommended Disposition /[] [facility]/ []  Follow-up with PCP Additional Notes: Patient states that on Friday she began to notice both of her hands swelling. Symptoms worsened over Saturday, where edema worsened and the hands began to crack and drain an off white, yellow fluid. Swelling is localized to both of their hands without extension into the forearm. Patient does confirm that a previously prescribed cream does not seem to be helping. Denies SOB, Chest Pain, Fever, or Red Streaks. Patient also states that there are Hives all over her body and that they are small and pruritic inconsistently. Appointment made in office today.   Reason for Disposition  MODERATE hand swelling (e.g., visible swelling of hand and fingers; pitting edema)  Answer Assessment - Initial Assessment Questions 1. ONSET: "When did the swelling start?" (e.g., minutes, hours, days)     Friday, Worsened Saturday.  2. LOCATION: "What part of the hand is swollen?"  "Are both hands swollen or just one hand?"     Both hands are swollen.  3. SEVERITY: "How bad is the swelling?" (e.g., localized; mild, moderate, severe)   - BALL OR LUMP: small ball or lump   - LOCALIZED: puffy or swollen area or patch of skin   - JOINT SWELLING: swelling of  a joint   - MILD: puffiness or mild swelling of fingers or hand   - MODERATE: fingers and hand are swollen   - SEVERE: swelling of entire hand and up into forearm     Moderate, does not exceed past the hands.  4. REDNESS: "Does the swelling look red or infected?"     Redness, Off-White to Yellow Drainage.  5. PAIN: "Is the swelling painful to touch?" If Yes, ask: "How painful is it?"   (Scale 1-10; mild, moderate or severe)     9, Interferes with ADLs 6. FEVER: "Do you have a fever?" If Yes, ask: "What is it, how was it measured, and when did it start?"      No 7. CAUSE: "What do you think is causing the hand swelling?" (e.g., heat, insect bite, pregnancy, recent injury)     Patient states she has sensitive skin and has been prescribed cream.  8. MEDICAL HISTORY: "Do you have a history of heart failure, kidney disease, liver failure, or cancer?"     Breast Cancer- Treated with Radiation.  9. RECURRENT SYMPTOM: "Have you had hand swelling before?" If Yes, ask: "When was the last time?" "What happened that time?"     First Time 10. OTHER SYMPTOMS: "Do you have any other symptoms?" (e.g., blurred vision, difficulty breathing, headache)       Hives, Generalized, Small, Widespread, Pruritic (Intermittent) 11. PREGNANCY: "Is there any chance you are pregnant?" "When was your last menstrual period?"       No  Protocols used: Hand Swelling-A-AH

## 2023-03-28 ENCOUNTER — Other Ambulatory Visit: Payer: Self-pay | Admitting: Family Medicine

## 2023-03-29 ENCOUNTER — Ambulatory Visit
Admission: RE | Admit: 2023-03-29 | Discharge: 2023-03-29 | Disposition: A | Discharge status: Home / Self Care | Payer: MEDICAID | Source: Ambulatory Visit | Attending: Radiation Oncology | Admitting: Radiation Oncology

## 2023-03-29 NOTE — Progress Notes (Signed)
Called to complete patient's telephone appt. today. No answer. Voicemail left. Appointment canceled.

## 2023-04-24 ENCOUNTER — Other Ambulatory Visit: Payer: Self-pay | Admitting: Family Medicine

## 2023-05-03 ENCOUNTER — Encounter: Payer: Self-pay | Admitting: Dermatology

## 2023-05-03 ENCOUNTER — Ambulatory Visit: Payer: MEDICAID | Admitting: Dermatology

## 2023-05-03 DIAGNOSIS — L589 Radiodermatitis, unspecified: Secondary | ICD-10-CM | POA: Diagnosis not present

## 2023-05-03 MED ORDER — CLOBETASOL PROPIONATE 0.05 % EX OINT
1.0000 | TOPICAL_OINTMENT | Freq: Two times a day (BID) | CUTANEOUS | 2 refills | Status: DC
Start: 2023-05-03 — End: 2023-09-24

## 2023-05-03 MED ORDER — TRIAMCINOLONE ACETONIDE 0.1 % EX OINT
1.0000 | TOPICAL_OINTMENT | Freq: Two times a day (BID) | CUTANEOUS | 2 refills | Status: AC
Start: 2023-05-03 — End: ?

## 2023-05-03 NOTE — Patient Instructions (Signed)

## 2023-05-03 NOTE — Progress Notes (Signed)
   New Patient Visit   Subjective  Bethany Mills is a 62 y.o. female who presents for the following: rash and blisters.  She underwent surgery and radiation for a Stage IA (pT1c, pN1, cM0, G2, ER+, PR+, HER2-  breast cancer in October and November 2024. She developed a full body itchy rash with blisters that come and go around the time of her diagnosis and subsequent treatment. She was given some low potency topical steroid creams from her PCP, which have not helped.  The following portions of the chart were reviewed this encounter and updated as appropriate: medications, allergies, medical history  Review of Systems:  No other skin or systemic complaints except as noted in HPI or Assessment and Plan.  Objective  Well appearing patient in no apparent distress; mood and affect are within normal limits.  A full examination was performed including scalp, head, eyes, ears, nose, lips, neck, chest, axillae, abdomen, back, buttocks, bilateral upper extremities, bilateral lower extremities, hands, feet, fingers, toes, fingernails, and toenails. All findings within normal limits unless otherwise noted below.   Relevant exam findings are noted in the Assessment and Plan.    Assessment & Plan   Radiation Dermatitis with Id reaction The patient was counseled regarding radiation dermatitis, a common side effect of radiation therapy, characterized by skin irritation, redness, and dryness in the treated area. I explained that the severity of radiation dermatitis can vary, ranging from mild erythema to more severe blistering and peeling. I emphasized the importance of gentle skin care to minimize further irritation, including using mild, fragrance-free soaps and avoiding harsh chemicals or tight clothing in the affected area. Moisturization with hypoallergenic, non-alcoholic lotions or creams is recommended to keep the skin hydrated and to help alleviate discomfort. For more severe cases, the use of  corticosteroid creams or topical antibiotics may be necessary to reduce inflammation and prevent infection. I also advised the patient to avoid direct sun exposure and to wear loose, protective clothing to prevent further irritation. If the condition worsens or if blistering or significant skin breakdown occurs, additional interventions such as oral antibiotics or advanced wound care may be required. The patient was encouraged to monitor the skin closely and to report any new or worsening symptoms during their radiation therapy course. The patient expressed understanding and was receptive to the recommendations provided. - triamcinolone ointment (KENALOG) 0.1 %; Apply 1 Application topically 2 (two) times daily. Apply 2x daily to the body.  Dispense: 453 g; Refill: 2 - clobetasol ointment (TEMOVATE) 0.05 %; Apply 1 Application topically 2 (two) times daily. Apple 2x per day on hands  Dispense: 30 g; Refill: 2   Return in about 2 weeks (around 05/17/2023) for f/u on rash.  IBernette Redbird, Surg Tech III, am acting as scribe for Gwenith Daily, MD.    We spent 45 min reviewing records, taking the patient history, providing face to face care with the patient, sending prescriptions.   Documentation: I have reviewed the above documentation for accuracy and completeness, and I agree with the above.  Gwenith Daily, MD

## 2023-05-17 ENCOUNTER — Ambulatory Visit: Payer: MEDICAID | Admitting: Dermatology

## 2023-05-22 ENCOUNTER — Other Ambulatory Visit: Payer: Self-pay | Admitting: Family Medicine

## 2023-05-31 ENCOUNTER — Encounter: Payer: Self-pay | Admitting: Dermatology

## 2023-05-31 ENCOUNTER — Ambulatory Visit: Payer: MEDICAID | Admitting: Dermatology

## 2023-05-31 DIAGNOSIS — L853 Xerosis cutis: Secondary | ICD-10-CM

## 2023-05-31 DIAGNOSIS — L589 Radiodermatitis, unspecified: Secondary | ICD-10-CM | POA: Diagnosis not present

## 2023-05-31 NOTE — Progress Notes (Signed)
   Follow-Up Visit   Subjective  Bethany Mills is a 63 y.o. female who presents for the following: follow up for a rash. She was being treated for Radiation Dermatitis on her chest, s/p breast cancer and was though to be having an Id reaction. She has been using triamcinolone ointment 0.1% BID as needed on her chest and clobetasol 0.5% ointment BID on her hands as needed. She feels like things are going well and she is pleased with the results.  The following portions of the chart were reviewed this encounter and updated as appropriate: medications, allergies, medical history  Review of Systems:  No other skin or systemic complaints except as noted in HPI or Assessment and Plan.  Objective  Well appearing patient in no apparent distress; mood and affect are within normal limits.   A focused examination was performed of the following areas:  Upper body  Relevant exam findings are noted in the Assessment and Plan.    Assessment & Plan   Radiation Dermatitis with Id reaction- Improved  Improved on triamcinolone ointment for the chest and clobetasol ointment for the hands The patient was counseled regarding radiation dermatitis, a common side effect of radiation therapy, characterized by skin irritation, redness, and dryness in the treated area. I explained that the severity of radiation dermatitis can vary, ranging from mild erythema to more severe blistering and peeling. I emphasized the importance of gentle skin care to minimize further irritation, including using mild, fragrance-free soaps and avoiding harsh chemicals or tight clothing in the affected area. Moisturization with hypoallergenic, non-alcoholic lotions or creams is recommended to keep the skin hydrated and to help alleviate discomfort. For more severe cases, the use of corticosteroid creams or topical antibiotics may be necessary to reduce inflammation and prevent infection. I also advised the patient to avoid direct sun  exposure and to wear loose, protective clothing to prevent further irritation. If the condition worsens or if blistering or significant skin breakdown occurs, additional interventions such as oral antibiotics or advanced wound care may be required. The patient was encouraged to monitor the skin closely and to report any new or worsening symptoms during their radiation therapy course. The patient expressed understanding and was receptive to the recommendations provided.  RADIATION DERMATITIS Left Antecubital Fossa, Left Breast, Left Forearm - Anterior, Left Hand - Anterior, Left Shoulder - Anterior, Left Upper Arm - Anterior, Neck - Anterior, Right Antecubital Fossa, Right Breast, Right Forearm - Anterior, Right Hand - Anterior, Right Shoulder - Anterior, Right Upper Arm - Anterior Related Medications triamcinolone ointment (KENALOG) 0.1 % Apply 1 Application topically 2 (two) times daily. Apply 2x daily to the body. clobetasol ointment (TEMOVATE) 0.05 % Apply 1 Application topically 2 (two) times daily. Apple 2x per day on hands XEROSIS CUTIS    Xerosis- Diffusely - diffuse xerotic patches - recommend gentle, hydrating skin care - gentle skin care handout given   Return if symptoms worsen or fail to improve.  Dominga Ferry, Surg Tech III, am acting as scribe for Gwenith Daily, MD.   Documentation: I have reviewed the above documentation for accuracy and completeness, and I agree with the above.  Gwenith Daily, MD

## 2023-06-04 ENCOUNTER — Ambulatory Visit: Payer: MEDICAID | Attending: General Surgery

## 2023-06-04 DIAGNOSIS — Z483 Aftercare following surgery for neoplasm: Secondary | ICD-10-CM | POA: Insufficient documentation

## 2023-06-20 ENCOUNTER — Other Ambulatory Visit: Payer: Self-pay | Admitting: Family Medicine

## 2023-06-21 ENCOUNTER — Inpatient Hospital Stay: Payer: MEDICAID | Attending: Hematology and Oncology | Admitting: Adult Health

## 2023-06-21 ENCOUNTER — Telehealth: Payer: Self-pay | Admitting: Adult Health

## 2023-06-21 ENCOUNTER — Encounter: Payer: Self-pay | Admitting: Adult Health

## 2023-06-21 VITALS — BP 130/70 | HR 79 | Temp 98.4°F | Resp 18 | Wt 137.3 lb

## 2023-06-21 DIAGNOSIS — F17210 Nicotine dependence, cigarettes, uncomplicated: Secondary | ICD-10-CM | POA: Insufficient documentation

## 2023-06-21 DIAGNOSIS — Z170 Estrogen receptor positive status [ER+]: Secondary | ICD-10-CM | POA: Insufficient documentation

## 2023-06-21 DIAGNOSIS — Z79899 Other long term (current) drug therapy: Secondary | ICD-10-CM | POA: Insufficient documentation

## 2023-06-21 DIAGNOSIS — Z1721 Progesterone receptor positive status: Secondary | ICD-10-CM | POA: Diagnosis not present

## 2023-06-21 DIAGNOSIS — Z1732 Human epidermal growth factor receptor 2 negative status: Secondary | ICD-10-CM | POA: Insufficient documentation

## 2023-06-21 DIAGNOSIS — Z79811 Long term (current) use of aromatase inhibitors: Secondary | ICD-10-CM | POA: Diagnosis not present

## 2023-06-21 DIAGNOSIS — C50412 Malignant neoplasm of upper-outer quadrant of left female breast: Secondary | ICD-10-CM | POA: Insufficient documentation

## 2023-06-21 NOTE — Patient Instructions (Signed)
Find a program that suits you best: when you want to quit, how you need support, where you live, and how you like to learn.    If you're ready to get started TODAY, consider scheduling a visit through [email or web address].  Appointments are available from 8am to 8pm, Monday to Friday.   Most health insurance plans will cover some level of tobacco cessation visits and medications.    Additional Resources: [employer] are also available to help you quit & provide the support you'll need. Many programs are available in both [place] and Spanish and have a long history of successfully helping people get off and stay off tobacco.    Quit Smoking Apps:  quitSTART at [employer].de QuitGuide?at [employer].dk Online education and resources: Smokefree  at [employer].gov Free Telephone Coaching: QuitNow,  Call 1-800-QUIT-NOW (651-029-3691) or Text- Ready to (534)329-3203 *Quitline [place] has teamed up with Medicaid to offer a free 14 week program    Vaping- Want to Quit? Free 24/7 support. Call [facility]  [place], [place], [place], [place], [place]  [facility]

## 2023-06-21 NOTE — Telephone Encounter (Signed)
Scheduled appointment per 2/13 los. Patient is aware of the made appointment.

## 2023-06-21 NOTE — Progress Notes (Signed)
SURVIVORSHIP VISIT:  BRIEF ONCOLOGIC HISTORY:  Oncology History  Malignant neoplasm of upper-outer quadrant of left breast in female, estrogen receptor positive (HCC)  11/13/2022 Initial Diagnosis   Screening mammogram detected left breast mass and distortion at 12 o'clock position measuring 1.2 cm, axilla negative, biopsy revealed grade 1 IDC with DCIS ER 100%, PR 85%, Ki67 10%, HER2 negative 1+   11/22/2022 Cancer Staging   Staging form: Breast, AJCC 8th Edition - Clinical stage from 11/22/2022: Stage IA (cT1c, cN0, cM0, G1, ER+, PR+, HER2-) - Signed by Serena Croissant, MD on 11/22/2022 Stage prefix: Initial diagnosis Histologic grading system: 3 grade system Laterality: Left Staged by: Pathologist and managing physician National guidelines used in treatment planning: Yes Type of national guideline used in treatment planning: NCCN   12/06/2022 Surgery   Left lumpectomy: Grade 2 IDC 1.1 cm, margins negative, 1/1 lymph node positive with extranodal extension ER 100%, PR 85%, HER2 1+ negative, Ki-67 10%   01/24/2023 - 02/27/2023 Radiation Therapy   Plan Name: Breast_L_BH Site: Breast, Left Technique: 3D Mode: Photon Dose Per Fraction: 2 Gy Prescribed Dose (Delivered / Prescribed): 50 Gy / 50 Gy Prescribed Fxs (Delivered / Prescribed): 25 / 25   Plan Name: Brst_L_SCV_BH Site: Breast, Left Technique: 3D Mode: Photon Dose Per Fraction: 2 Gy Prescribed Dose (Delivered / Prescribed): 50 Gy / 50 Gy Prescribed Fxs (Delivered / Prescribed): 25 / 25   03/20/2023 -  Anti-estrogen oral therapy   Anastrozole x 7 years   03/20/2023 Cancer Staging   Staging form: Breast, AJCC 8th Edition - Pathologic: Stage IA (pT1c, pN1, cM0, G2, ER+, PR+, HER2-) - Signed by Serena Croissant, MD on 03/20/2023 Histologic grading system: 3 grade system     INTERVAL HISTORY:  Bethany Mills to review her survivorship care plan detailing her treatment course for breast cancer, as well as monitoring long-term side  effects of that treatment, education regarding health maintenance, screening, and overall wellness and health promotion.     Overall, Bethany Mills reports feeling quite well.  She has occasional left breast pain that is slowly improving.  She is taking anastrozole daily.  She notes she does not have an appetite since starting the anastrozole.  She is a current every day smoker of 1ppd x 45 years.    REVIEW OF SYSTEMS:  Review of Systems  Constitutional:  Negative for appetite change, chills, fatigue, fever and unexpected weight change.  HENT:   Negative for hearing loss, lump/mass and trouble swallowing.   Eyes:  Negative for eye problems and icterus.  Respiratory:  Negative for chest tightness, cough and shortness of breath.   Cardiovascular:  Negative for chest pain, leg swelling and palpitations.  Gastrointestinal:  Negative for abdominal distention, abdominal pain, constipation, diarrhea, nausea and vomiting.  Endocrine: Negative for hot flashes.  Genitourinary:  Negative for difficulty urinating.   Musculoskeletal:  Negative for arthralgias.  Skin:  Negative for itching and rash.  Neurological:  Negative for dizziness, extremity weakness, headaches and numbness.  Hematological:  Negative for adenopathy. Does not bruise/bleed easily.  Psychiatric/Behavioral:  Negative for depression. The patient is not nervous/anxious.   Breast: Denies any new nodularity, masses, tenderness, nipple changes, or nipple discharge.       PAST MEDICAL/SURGICAL HISTORY:  Past Medical History:  Diagnosis Date   Allergies    Anxiety    COPD (chronic obstructive pulmonary disease) (HCC)    Hyperglycemic coma (HCC)    Hypertension    Past Surgical History:  Procedure Laterality Date  ABDOMINAL HYSTERECTOMY     BREAST BIOPSY Left 11/13/2022   [place] LT BREAST BX W LOC DEV 1ST LESION IMG BX SPEC US GUIDE 11/13/2022 [facility]   BREAST BIOPSY  12/05/2022   MM LT RADIOACTIVE SEED LOC MAMMO GUIDE  12/05/2022 [facility]   BREAST LUMPECTOMY WITH RADIOACTIVE SEED AND SENTINEL LYMPH NODE BIOPSY Left 12/06/2022   Procedure: LEFT BREAST SEED GUIDED LUMPECTOMY, LEFT AXILLARY SENTINEL NODE BIOPSY;  Surgeon: Emelia Loron, MD;  Location: [facility];  Service: General;  Laterality: Left;  PEC BLOCK     ALLERGIES:  Allergies  Allergen Reactions   Codeine Nausea And Vomiting   Neomycin-Polymyxin B Gu Other (See Comments)    Causes eyes to swell, run and crust over.    Neomycin-Bacitracin Zn-Polymyx Hives and Rash     CURRENT MEDICATIONS:  Outpatient Encounter Medications as of 06/21/2023  Medication Sig   ALPRAZolam (XANAX) 0.5 MG tablet TAKE 1 TO 2 TABLETS BY MOUTH 3 TIMES DAILY AS NEEDED FOR ANXIETY.   amLODipine (NORVASC) 10 MG tablet TAKE 1 TABLET BY MOUTH EVERY DAY   anastrozole (ARIMIDEX) 1 MG tablet Take 1 tablet (1 mg total) by mouth daily.   aspirin 81 MG chewable tablet Chew 1 tablet (81 mg total) by mouth daily.   busPIRone (BUSPAR) 5 MG tablet Take 5 mg by mouth 2 (two) times daily.   clobetasol ointment (TEMOVATE) 0.05 % Apply 1 Application topically 2 (two) times daily. Apple 2x per day on hands   oxyCODONE (OXY IR/ROXICODONE) 5 MG immediate release tablet Take 1 tablet (5 mg total) by mouth every 6 (six) hours as needed.   predniSONE (DELTASONE) 20 MG tablet 3 tabs poqday 1-2, 2 tabs poqday 3-4, 1 tab poqday 5-6   rosuvastatin (CRESTOR) 20 MG tablet Take 1 tablet (20 mg total) by mouth daily.   triamcinolone ointment (KENALOG) 0.1 % Apply 1 Application topically 2 (two) times daily. Apply 2x daily to the body.   [DISCONTINUED] ALPRAZolam (XANAX) 0.5 MG tablet TAKE 1 TO 2 TABLETS BY MOUTH 3 TIMES DAILY AS NEEDED FOR ANXIETY.   No facility-administered encounter medications on file as of 06/21/2023.     ONCOLOGIC FAMILY HISTORY:  Family History  Problem Relation Age of Onset   Heart disease Mother    Hypertension Mother    COPD Mother    Cancer  Mother    Breast cancer Mother    Heart disease Father    Hypertension Father    Cancer Maternal Grandmother    Diabetes Paternal Grandmother    Heart disease Paternal Grandfather      SOCIAL HISTORY:  Social History   Socioeconomic History   Marital status: Divorced    Spouse name: Not on file   Number of children: Not on file   Years of education: Not on file   Highest education level: Not on file  Occupational History   Not on file  Tobacco Use   Smoking status: Every Day    Current packs/day: 1.00    Average packs/day: 1 pack/day for 15.0 years (15.0 ttl pk-yrs)    Types: Cigarettes   Smokeless tobacco: Never  Substance and Sexual Activity   Alcohol use: Yes    Comment: beer-1-5 beers a day   Drug use: Yes    Types: Marijuana    Comment: yesterday last dose 12/05/22   Sexual activity: Not Currently  Other Topics Concern   Not on file  Social History Narrative   Not on  file   Social Drivers of Health   Financial Resource Strain: Not on file  Food Insecurity: No Food Insecurity (11/23/2022)   Hunger Vital Sign    Worried About Running Out of Food in the Last Year: Never true    Ran Out of Food in the Last Year: Never true  Transportation Needs: No Transportation Needs (11/23/2022)   PRAPARE - Administrator, Civil Service (Medical): No    Lack of Transportation (Non-Medical): No  Physical Activity: Not on file  Stress: Not on file  Social Connections: Not on file  Intimate Partner Violence: Not At Risk (11/23/2022)   Humiliation, Afraid, Rape, and Kick questionnaire    Fear of Current or Ex-Partner: No    Emotionally Abused: No    Physically Abused: No    Sexually Abused: No     OBSERVATIONS/OBJECTIVE:  There were no vitals taken for this visit. GENERAL: Patient is a well appearing female in no acute distress HEENT:  Sclerae anicteric.  Oropharynx clear and moist. No ulcerations or evidence of oropharyngeal candidiasis. Neck is supple.   NODES:  No cervical, supraclavicular, or axillary lymphadenopathy palpated.  BREAST EXAM:  left breast s/p lumpectomy and radiation, no sign of local recurrence,right breast benign LUNGS:  Clear to auscultation bilaterally.  No wheezes or rhonchi. HEART:  Regular rate and rhythm. No murmur appreciated. ABDOMEN:  Soft, nontender.  Positive, normoactive bowel sounds. No organomegaly palpated. MSK:  No focal spinal tenderness to palpation. Full range of motion bilaterally in the upper extremities. EXTREMITIES:  No peripheral edema.   SKIN:  Clear with no obvious rashes or skin changes. No nail dyscrasia. NEURO:  Nonfocal. Well oriented.  Appropriate affect.   LABORATORY DATA:  None for this visit.  DIAGNOSTIC IMAGING:  None for this visit.    ASSESSMENT AND PLAN:  Ms.. Mills is a pleasant 63 y.o. female with Stage IA left breast invasive ductal carcinoma, ER+/PR+/HER2-, diagnosed in 11/2022, treated with lumpectomy, adjuvant radiation therapy, and anti-estrogen therapy with Anastrozole beginning in 03/2023.  She presents to the Survivorship Clinic for our initial meeting and routine follow-up post-completion of treatment for breast cancer.    1. Stage IA left breast cancer:  Bethany Mills is continuing to recover from definitive treatment for breast cancer. She will follow-up with her medical oncologist, Dr.  Pamelia Hoit in 6 months with history and physical exam per surveillance protocol.  She will continue her anti-estrogen therapy with Anastrozole. Thus far, she is tolerating the Anastrozole well, with minimal side effects. Her mammogram is due 10/2023; orders placed today.   Today, a comprehensive survivorship care plan and treatment summary was reviewed with the patient today detailing her breast cancer diagnosis, treatment course, potential late/long-term effects of treatment, appropriate follow-up care with recommendations for the future, and patient education resources.  A copy of this  summary, along with a letter will be sent to the patient's primary care provider via mail/fax/In Basket message after today's visit.    2. Bone health:  Given Bethany Mills's age/history of breast cancer and her current treatment regimen including anti-estrogen therapy with Anastrozole, she is at risk for bone demineralization.  Has not yet undergone bone density testing.  I placed orders for this to be completed in the next few months.  She was given education on specific activities to promote bone health.  3. Cancer screening:  Due to Bethany Mills's history and her age, she should receive screening for skin cancers, colon cancer, lung cancer, and  gynecologic cancers.  The information and recommendations are listed on the patient's comprehensive care plan/treatment summary and were reviewed in detail with the patient.  Suggested she talk to her primary care provider about lung cancer screening as she declined for me to order this for her today.  4. Health maintenance and wellness promotion: Bethany Mills was encouraged to consume 5-7 servings of fruits and vegetables per day. We reviewed the "Nutrition Rainbow" handout.  She was also encouraged to engage in moderate to vigorous exercise for 30 minutes per day most days of the week.  She was instructed to limit her alcohol consumption and was encouraged stop smoking.     5. Support services/counseling: It is not uncommon for this period of the patient's cancer care trajectory to be one of many emotions and stressors.   She was given information regarding our available services and encouraged to contact me with any questions or for help enrolling in any of our support group/programs.    Follow up instructions:    -Return to cancer center in 6 months for follow-up with Dr. Pamelia Hoit -Mammogram due in 10/2023 -She is welcome to return back to the Survivorship Clinic at any time; no additional follow-up needed at this time.  -Consider referral back to  survivorship as a long-term survivor for continued surveillance  The patient was provided an opportunity to ask questions and all were answered. The patient agreed with the plan and demonstrated an understanding of the instructions.   Total encounter time:30 minutes*in face-to-face visit time, chart review, lab review, care coordination, order entry, and documentation of the encounter time.    Lillard Anes, NP 06/21/23 8:50 AM Medical Oncology and Hematology [facility] [address] [place], [place] [address] Tel. [phone]    Fax. [phone]  *Total Encounter Time as defined by the Centers for Medicare and Medicaid Services includes, in addition to the face-to-face time of a patient visit (documented in the note above) non-face-to-face time: obtaining and reviewing outside history, ordering and reviewing medications, tests or procedures, care coordination (communications with other health care professionals or caregivers) and documentation in the medical record.

## 2023-07-16 ENCOUNTER — Other Ambulatory Visit: Payer: Self-pay | Admitting: Family Medicine

## 2023-08-14 ENCOUNTER — Other Ambulatory Visit: Payer: Self-pay | Admitting: Family Medicine

## 2023-08-24 ENCOUNTER — Encounter: Payer: Self-pay | Admitting: Family Medicine

## 2023-08-24 ENCOUNTER — Ambulatory Visit: Payer: MEDICAID | Admitting: Family Medicine

## 2023-08-24 VITALS — BP 132/62 | HR 81 | Temp 98.4°F | Ht 63.0 in | Wt 141.2 lb

## 2023-08-24 DIAGNOSIS — F411 Generalized anxiety disorder: Secondary | ICD-10-CM

## 2023-08-24 DIAGNOSIS — R739 Hyperglycemia, unspecified: Secondary | ICD-10-CM | POA: Diagnosis not present

## 2023-08-24 MED ORDER — ALPRAZOLAM 0.5 MG PO TABS: 0.5000 mg | ORAL_TABLET | Freq: Two times a day (BID) | ORAL | 0 refills | Status: DC | PRN

## 2023-08-24 MED ORDER — BUSPIRONE HCL 10 MG PO TABS: 10.0000 mg | ORAL_TABLET | Freq: Two times a day (BID) | ORAL | 3 refills | Status: DC

## 2023-08-24 NOTE — Progress Notes (Signed)
 Subjective:    Patient ID: Bethany Mills, female    DOB: 16-Jan-1961, 63 y.o.   MRN: [id number]  HPI Patient states that in the past she took BuSpar  for anxiety.  She states that she took 20 mg twice daily.  She reports anxiety on a daily basis usually worse in the afternoon.  She states that she feels like she is going to jump out of her skin.  She has been using Xanax  for this.  She was on Xanax  while she was receiving radiation therapy for breast cancer.  She is requesting a refill on the Xanax  as well.  Denies depression or suicidal thoughts.  Does have constant daily anxiety.  Over the summer her blood sugar was elevated.  This has not been rechecked since July.  Past Medical History:  Diagnosis Date   Allergies    Anxiety    COPD (chronic obstructive pulmonary disease) (HCC)    Hyperglycemic coma (HCC)    Hypertension    Past Surgical History:  Procedure Laterality Date   ABDOMINAL HYSTERECTOMY     BREAST BIOPSY Left 11/13/2022   US  LT BREAST BX W LOC DEV 1ST LESION IMG BX SPEC US  GUIDE 11/13/2022 [facility]   BREAST BIOPSY  12/05/2022   MM LT RADIOACTIVE SEED LOC MAMMO GUIDE 12/05/2022 [facility]   BREAST LUMPECTOMY WITH RADIOACTIVE SEED AND SENTINEL LYMPH NODE BIOPSY Left 12/06/2022   Procedure: LEFT BREAST SEED GUIDED LUMPECTOMY, LEFT AXILLARY SENTINEL NODE BIOPSY;  Surgeon: Ebbie Cough, MD;  Location: [facility];  Service: General;  Laterality: Left;  PEC BLOCK   Current Outpatient Medications on File Prior to Visit  Medication Sig Dispense Refill   ALPRAZolam  (XANAX ) 0.5 MG tablet TAKE 1 TO 2 TABLETS BY MOUTH 3 TIMES DAILY AS NEEDED FOR ANXIETY. 90 tablet 0   amLODipine  (NORVASC ) 10 MG tablet TAKE 1 TABLET BY MOUTH EVERY DAY 90 tablet 2   anastrozole  (ARIMIDEX ) 1 MG tablet Take 1 tablet (1 mg total) by mouth daily. 90 tablet 3   aspirin  81 MG chewable tablet Chew 1 tablet (81 mg total) by mouth daily. 30 tablet 0   clobetasol  ointment  (TEMOVATE ) 0.05 % Apply 1 Application topically 2 (two) times daily. Apple 2x per day on hands 30 g 2   rosuvastatin  (CRESTOR ) 20 MG tablet TAKE 1 TABLET BY MOUTH EVERY DAY 90 tablet 3   triamcinolone  ointment (KENALOG ) 0.1 % Apply 1 Application topically 2 (two) times daily. Apply 2x daily to the body. 453 g 2   No current facility-administered medications on file prior to visit.   Allergies  Allergen Reactions   Codeine Nausea And Vomiting   Neomycin-Polymyxin B Gu Other (See Comments)    Causes eyes to swell, run and crust over.    Neomycin-Bacitracin Zn-Polymyx Hives and Rash   Social History   Socioeconomic History   Marital status: Divorced    Spouse name: Not on file   Number of children: Not on file   Years of education: Not on file   Highest education level: Not on file  Occupational History   Not on file  Tobacco Use   Smoking status: Every Day    Current packs/day: 1.00    Average packs/day: 1 pack/day for 15.0 years (15.0 ttl pk-yrs)    Types: Cigarettes   Smokeless tobacco: Never  Substance and Sexual Activity   Alcohol use: Yes    Comment: beer-1-5 beers a day   Drug use: Yes  Types: Marijuana    Comment: yesterday last dose 12/05/22   Sexual activity: Not Currently  Other Topics Concern   Not on file  Social History Narrative   Not on file   Social Drivers of Health   Financial Resource Strain: Not on file  Food Insecurity: No Food Insecurity (11/23/2022)   Hunger Vital Sign    Worried About Running Out of Food in the Last Year: Never true    Ran Out of Food in the Last Year: Never true  Transportation Needs: No Transportation Needs (11/23/2022)   PRAPARE - Administrator, Civil Service (Medical): No    Lack of Transportation (Non-Medical): No  Physical Activity: Not on file  Stress: Not on file  Social Connections: Not on file  Intimate Partner Violence: Not At Risk (11/23/2022)   Humiliation, Afraid, Rape, and Kick questionnaire     Fear of Current or Ex-Partner: No    Emotionally Abused: No    Physically Abused: No    Sexually Abused: No    Review of Systems  All other systems reviewed and are negative.      Objective:   Physical Exam Vitals reviewed.  Constitutional:      Appearance: Normal appearance.  Cardiovascular:     Rate and Rhythm: Normal rate and regular rhythm.     Heart sounds: Normal heart sounds.  Pulmonary:     Effort: Pulmonary effort is normal.     Breath sounds: Normal breath sounds.  Neurological:     General: No focal deficit present.     Mental Status: She is alert and oriented to person, place, and time.  Psychiatric:        Mood and Affect: Mood normal.        Behavior: Behavior normal.        Thought Content: Thought content normal.        Judgment: Judgment normal.           Assessment & Plan:  Elevated blood sugar - Plan: Hemoglobin A1c, Basic Metabolic Panel Without GFR  GAD (generalized anxiety disorder) I believe it is reasonable to start BuSpar  10 mg twice daily.  Recheck with her PCP in 1 month to determine if they need to uptitrate.  I did refill Xanax  30 tablets but I recommended she use the medication sparingly to avoid habituation and dependency.  As the BuSpar  takes effect, I would like to wean the patient away from Xanax .  Patient is in agreement.  Follow-up in 1 month with her PCP.  Meanwhile check BMP and hemoglobin A1c to monitor her blood sugar as this was significantly elevated in July of last year.

## 2023-08-25 LAB — BASIC METABOLIC PANEL WITHOUT GFR
BUN: 11 mg/dL (ref 7–25)
CO2: 26 mmol/L (ref 20–32)
Calcium: 9.9 mg/dL (ref 8.6–10.4)
Chloride: 104 mmol/L (ref 98–110)
Creat: 0.61 mg/dL (ref 0.50–1.05)
Glucose, Bld: 190 mg/dL — ABNORMAL HIGH (ref 65–99)
Potassium: 4.2 mmol/L (ref 3.5–5.3)
Sodium: 140 mmol/L (ref 135–146)

## 2023-08-25 LAB — HEMOGLOBIN A1C
Hgb A1c MFr Bld: 6.5 % — ABNORMAL HIGH (ref <5.7)
Mean Plasma Glucose: 140 mg/dL
eAG (mmol/L): 7.7 mmol/L

## 2023-08-29 ENCOUNTER — Ambulatory Visit: Payer: MEDICAID | Admitting: Dermatology

## 2023-09-24 ENCOUNTER — Ambulatory Visit (INDEPENDENT_AMBULATORY_CARE_PROVIDER_SITE_OTHER): Payer: MEDICAID | Admitting: Family Medicine

## 2023-09-24 ENCOUNTER — Encounter: Payer: Self-pay | Admitting: Family Medicine

## 2023-09-24 VITALS — BP 125/71 | HR 88 | Ht 63.0 in | Wt 140.0 lb

## 2023-09-24 DIAGNOSIS — E559 Vitamin D deficiency, unspecified: Secondary | ICD-10-CM

## 2023-09-24 DIAGNOSIS — C50412 Malignant neoplasm of upper-outer quadrant of left female breast: Secondary | ICD-10-CM

## 2023-09-24 DIAGNOSIS — Z170 Estrogen receptor positive status [ER+]: Secondary | ICD-10-CM

## 2023-09-24 DIAGNOSIS — E782 Mixed hyperlipidemia: Secondary | ICD-10-CM

## 2023-09-24 DIAGNOSIS — L589 Radiodermatitis, unspecified: Secondary | ICD-10-CM

## 2023-09-24 DIAGNOSIS — E1169 Type 2 diabetes mellitus with other specified complication: Secondary | ICD-10-CM | POA: Diagnosis not present

## 2023-09-24 DIAGNOSIS — F418 Other specified anxiety disorders: Secondary | ICD-10-CM

## 2023-09-24 DIAGNOSIS — Z1329 Encounter for screening for other suspected endocrine disorder: Secondary | ICD-10-CM

## 2023-09-24 DIAGNOSIS — I10 Essential (primary) hypertension: Secondary | ICD-10-CM | POA: Diagnosis not present

## 2023-09-24 DIAGNOSIS — E119 Type 2 diabetes mellitus without complications: Secondary | ICD-10-CM | POA: Insufficient documentation

## 2023-09-24 MED ORDER — ALPRAZOLAM 0.5 MG PO TABS: 0.5000 mg | ORAL_TABLET | Freq: Two times a day (BID) | ORAL | 0 refills | Status: DC | PRN

## 2023-09-24 MED ORDER — BUSPIRONE HCL 10 MG PO TABS: 15.0000 mg | ORAL_TABLET | Freq: Two times a day (BID) | ORAL | 1 refills | Status: DC

## 2023-09-24 MED ORDER — CLOBETASOL PROPIONATE 0.05 % EX OINT: 1.0000 | TOPICAL_OINTMENT | Freq: Two times a day (BID) | CUTANEOUS | 2 refills | Status: AC

## 2023-09-24 NOTE — Assessment & Plan Note (Signed)
 A1c 6.5% from 6.2%, advised lifestyle modifications. Discussed recommended nutrition and exercise. Advised to monitor daily fasting BG and report if sustains >120. uACR today. Foot exam due. Vaccines declined. Retinal eye exam done. Recommend heart healthy diet such as Mediterranean diet with whole grains, fruits, vegetable, fish, lean meats, nuts, and olive oil. Limit salt. Encouraged moderate walking, 3-5 times/week for 30-50 minutes each session. Aim for at least 150 minutes.week. Goal should be pace of 3 miles/hours, or walking 1.5 miles in 30 minutes. Seek medical care for urinary frequency, extreme thirst, vision changes, lightheadedness, dizziness.  Follow up in 3 months

## 2023-09-24 NOTE — Assessment & Plan Note (Signed)
 GAD 2. Anhedonia is primary symptom. Will increase Buspar  to 15mg  BID, previously taken 20mg  BID with psychiatry. Denies SI/HI. Xanax  sparingly to prevent dependence.

## 2023-09-24 NOTE — Assessment & Plan Note (Signed)
 Labs today. Continue crestor  20mg  daily. LDL goal <70. Your labs showed elevated cholesterol. I recommend consuming a heart healthy diet such as Mediterranean diet or DASH diet with whole grains, fruits, vegetable, fish, lean meats, nuts, and olive oil. Limit sweets and processed foods. I also encourage moderate intensity exercise 150 minutes weekly. This is 3-5 times weekly for 30-50 minutes each session. Goal should be pace of 3 miles/hours, or walking 1.5 miles in 30 minutes. The 10-year ASCVD risk score (Arnett DK, et al., 2019) is: 21%

## 2023-09-24 NOTE — Progress Notes (Signed)
 Subjective:  HPI: Bethany Mills is a 63 y.o. female presenting on 09/24/2023 for Medical Management of Chronic Issues (Elevated glucose levels  )   HPI Patient is in today for chronic condition management. Recent labs revealed A1c 6.5%, she does admit to being less physically active due to her depression and anxiety, was also started on Buspar  10mg  BID. Is seeing only minimal improvement.  Dyshidrotic eczema: improving on clobetasol  ointment 0.05%, followed by dermatology  Left breast cancer: followed by Oncology, Debbie Fails, s/p lumpectomy and radiation, Anastrozole  x7y, 10/2023 mammogram, needs DEXA  Nicotine dependence: current every day smoker 1ppd x45 years  DIABETES Hypoglycemic episodes:no Polydipsia/polyuria: no Visual disturbance: no Chest pain: no Paresthesias: no Glucose Monitoring: rarely  Accucheck frequency: Not Checking  Fasting glucose:  Post prandial:  Evening:  Before meals: Taking Insulin?: no  Long acting insulin:  Short acting insulin: Blood Pressure Monitoring: not checking Retinal Examination: Up to Date Foot Exam: Not up to Date Diabetic Education: Not Completed Pneumovax: Not up to Date Influenza: Not up to Date Aspirin : no  HYPERTENSION / HYPERLIPIDEMIA Satisfied with current treatment? yes Duration of hypertension: chronic BP monitoring frequency: not checking BP range:  BP medication side effects: no Past BP meds: amlodipine  Duration of hyperlipidemia: chronic Cholesterol medication side effects: no Cholesterol supplements: none Past cholesterol medications:  crestor  Medication compliance: excellent compliance Aspirin : yes Recent stressors: no Recurrent headaches: no Visual changes: no Palpitations: no Dyspnea: no Chest pain: no Lower extremity edema: no Dizzy/lightheaded: no     09/24/2023    2:12 PM 02/14/2023    3:27 PM 12/04/2022    3:45 PM 11/21/2022    9:15 AM  GAD 7 : Generalized Anxiety Score  Nervous, Anxious, on Edge  0 3 3 3   Control/stop worrying 0 3 1 3   Worry too much - different things  1 0 3  Trouble relaxing   0 3  Restless  1 0 3  Easily annoyed or irritable  0 0 0  Afraid - awful might happen  0 0 3  Total GAD 7 Score   4 18  Anxiety Difficulty Not difficult at all Somewhat difficult         09/24/2023    2:12 PM 08/24/2023    2:54 PM 02/14/2023    3:26 PM 12/04/2022    3:45 PM 11/23/2022    1:50 PM  Depression screen PHQ 2/9  Decreased Interest 2 0 3 0 1  Down, Depressed, Hopeless 0 0 0 0 1  PHQ - 2 Score 2 0 3 0 2  Altered sleeping 0  3 0   Tired, decreased energy 0  3 0   Change in appetite 0  3 0   Feeling bad or failure about yourself  0  0 0   Trouble concentrating 0  0 0   Moving slowly or fidgety/restless 0  1 0   Suicidal thoughts 0  0 0   PHQ-9 Score 2  13 0   Difficult doing work/chores Not difficult at all  Very difficult       Review of Systems  All other systems reviewed and are negative.   Relevant past medical history reviewed and updated as indicated.   Past Medical History:  Diagnosis Date   Allergies    Anxiety    COPD (chronic obstructive pulmonary disease) (HCC)    Hyperglycemic coma (HCC)    Hypertension      Past Surgical History:  Procedure Laterality Date  ABDOMINAL HYSTERECTOMY     BREAST BIOPSY Left 11/13/2022   US  LT BREAST BX W LOC DEV 1ST LESION IMG BX SPEC US  GUIDE 11/13/2022 [facility]   BREAST BIOPSY  12/05/2022   MM LT RADIOACTIVE SEED LOC MAMMO GUIDE 12/05/2022 [facility]   BREAST LUMPECTOMY WITH RADIOACTIVE SEED AND SENTINEL LYMPH NODE BIOPSY Left 12/06/2022   Procedure: LEFT BREAST SEED GUIDED LUMPECTOMY, LEFT AXILLARY SENTINEL NODE BIOPSY;  Surgeon: Enid Harry, MD;  Location: [facility];  Service: General;  Laterality: Left;  PEC BLOCK    Allergies and medications reviewed and updated.   Current Outpatient Medications:    amLODipine  (NORVASC ) 10 MG tablet, TAKE 1 TABLET BY MOUTH EVERY DAY,  Disp: 90 tablet, Rfl: 2   anastrozole  (ARIMIDEX ) 1 MG tablet, Take 1 tablet (1 mg total) by mouth daily., Disp: 90 tablet, Rfl: 3   aspirin  81 MG chewable tablet, Chew 1 tablet (81 mg total) by mouth daily., Disp: 30 tablet, Rfl: 0   rosuvastatin  (CRESTOR ) 20 MG tablet, TAKE 1 TABLET BY MOUTH EVERY DAY, Disp: 90 tablet, Rfl: 3   triamcinolone  ointment (KENALOG ) 0.1 %, Apply 1 Application topically 2 (two) times daily. Apply 2x daily to the body., Disp: 453 g, Rfl: 2   ALPRAZolam  (XANAX ) 0.5 MG tablet, Take 1 tablet (0.5 mg total) by mouth 2 (two) times daily as needed for anxiety., Disp: 30 tablet, Rfl: 0   busPIRone  (BUSPAR ) 10 MG tablet, Take 1.5 tablets (15 mg total) by mouth 2 (two) times daily., Disp: 90 tablet, Rfl: 1   clobetasol  ointment (TEMOVATE ) 0.05 %, Apply 1 Application topically 2 (two) times daily. Apple 2x per day on hands, Disp: 30 g, Rfl: 2  Allergies  Allergen Reactions   Codeine Nausea And Vomiting   Neomycin-Polymyxin B Gu Other (See Comments)    Causes eyes to swell, run and crust over.    Neomycin-Bacitracin Zn-Polymyx Hives and Rash    Objective:   BP 125/71   Pulse 88   Ht 5\' 3"  (1.6 m)   Wt 140 lb 0.6 oz (63.5 kg)   SpO2 96%   BMI 24.81 kg/m      09/24/2023    2:01 PM 08/24/2023    2:49 PM 06/21/2023   10:46 AM  Vitals with BMI  Height 5\' 3"  5\' 3"    Weight 140 lbs 1 oz 141 lbs 3 oz 137 lbs 5 oz  BMI 24.81 25.02   Systolic 125 132 161  Diastolic 71 62 70  Pulse 88 81 79     Physical Exam Vitals and nursing note reviewed.  Constitutional:      Appearance: Normal appearance. She is normal weight.  HENT:     Head: Normocephalic and atraumatic.  Cardiovascular:     Rate and Rhythm: Normal rate and regular rhythm.     Pulses: Normal pulses.     Heart sounds: Normal heart sounds.  Pulmonary:     Effort: Pulmonary effort is normal.     Breath sounds: Normal breath sounds.  Skin:    General: Skin is warm and dry.  Neurological:     General: No  focal deficit present.     Mental Status: She is alert and oriented to person, place, and time. Mental status is at baseline.  Psychiatric:        Mood and Affect: Mood normal.        Behavior: Behavior normal.        Thought Content: Thought content  normal.        Judgment: Judgment normal.     Assessment & Plan:  Controlled type 2 diabetes mellitus without complication, without long-term current use of insulin (HCC) Assessment & Plan: A1c 6.5% from 6.2%, advised lifestyle modifications. Discussed recommended nutrition and exercise. Advised to monitor daily fasting BG and report if sustains >120. uACR today. Foot exam due. Vaccines declined. Retinal eye exam done. Recommend heart healthy diet such as Mediterranean diet with whole grains, fruits, vegetable, fish, lean meats, nuts, and olive oil. Limit salt. Encouraged moderate walking, 3-5 times/week for 30-50 minutes each session. Aim for at least 150 minutes.week. Goal should be pace of 3 miles/hours, or walking 1.5 miles in 30 minutes. Seek medical care for urinary frequency, extreme thirst, vision changes, lightheadedness, dizziness.  Follow up in 3 months  Orders: -     Lipid panel -     TSH -     VITAMIN D 25 Hydroxy (Vit-D Deficiency, Fractures) -     Microalbumin / creatinine urine ratio -     CBC with Differential/Platelet  Radiation dermatitis -     Clobetasol  Propionate; Apply 1 Application topically 2 (two) times daily. Apple 2x per day on hands  Dispense: 30 g; Refill: 2  Mixed hyperlipidemia Assessment & Plan: Labs today. Continue crestor  20mg  daily. LDL goal <70. Your labs showed elevated cholesterol. I recommend consuming a heart healthy diet such as Mediterranean diet or DASH diet with whole grains, fruits, vegetable, fish, lean meats, nuts, and olive oil. Limit sweets and processed foods. I also encourage moderate intensity exercise 150 minutes weekly. This is 3-5 times weekly for 30-50 minutes each session. Goal should  be pace of 3 miles/hours, or walking 1.5 miles in 30 minutes. The 10-year ASCVD risk score (Arnett DK, et al., 2019) is: 21%   Orders: -     Lipid panel -     TSH -     VITAMIN D 25 Hydroxy (Vit-D Deficiency, Fractures) -     Microalbumin / creatinine urine ratio -     CBC with Differential/Platelet  Primary hypertension Assessment & Plan: Chronic well controlled on amlodipine  10mg  daily. Recommend heart healthy diet such as Mediterranean diet with whole grains, fruits, vegetable, fish, lean meats, nuts, and olive oil. Limit salt. Encouraged moderate walking, 3-5 times/week for 30-50 minutes each session. Aim for at least 150 minutes.week. Goal should be pace of 3 miles/hours, or walking 1.5 miles in 30 minutes. Avoid tobacco products. Avoid excess alcohol. Take medications as prescribed and bring medications and blood pressure log with cuff to each office visit. Seek medical care for chest pain, palpitations, shortness of breath with exertion, dizziness/lightheadedness, vision changes, recurrent headaches, or swelling of extremities. Labs today  Orders: -     Lipid panel -     TSH -     VITAMIN D 25 Hydroxy (Vit-D Deficiency, Fractures) -     Microalbumin / creatinine urine ratio -     CBC with Differential/Platelet  Screening for thyroid disorder -     TSH  Vitamin D deficiency -     VITAMIN D 25 Hydroxy (Vit-D Deficiency, Fractures)  Malignant neoplasm of upper-outer quadrant of left breast in female, estrogen receptor positive (HCC)  Situational anxiety Assessment & Plan: GAD 2. Anhedonia is primary symptom. Will increase Buspar  to 15mg  BID, previously taken 20mg  BID with psychiatry. Denies SI/HI. Xanax  sparingly to prevent dependence.    Other orders -     busPIRone  HCl; Take  1.5 tablets (15 mg total) by mouth 2 (two) times daily.  Dispense: 90 tablet; Refill: 1 -     ALPRAZolam ; Take 1 tablet (0.5 mg total) by mouth 2 (two) times daily as needed for anxiety.  Dispense: 30  tablet; Refill: 0     Follow up plan: Return in about 4 weeks (around 10/22/2023) for annual physical.  Jenelle Mis, FNP

## 2023-09-24 NOTE — Assessment & Plan Note (Signed)
 Chronic well controlled on amlodipine  10mg  daily. Recommend heart healthy diet such as Mediterranean diet with whole grains, fruits, vegetable, fish, lean meats, nuts, and olive oil. Limit salt. Encouraged moderate walking, 3-5 times/week for 30-50 minutes each session. Aim for at least 150 minutes.week. Goal should be pace of 3 miles/hours, or walking 1.5 miles in 30 minutes. Avoid tobacco products. Avoid excess alcohol. Take medications as prescribed and bring medications and blood pressure log with cuff to each office visit. Seek medical care for chest pain, palpitations, shortness of breath with exertion, dizziness/lightheadedness, vision changes, recurrent headaches, or swelling of extremities. Labs today

## 2023-09-26 LAB — CBC WITH DIFFERENTIAL/PLATELET
Absolute Lymphocytes: 1402 {cells}/uL (ref 850–3900)
Absolute Monocytes: 861 {cells}/uL (ref 200–950)
Basophils Absolute: 22 {cells}/uL (ref 0–200)
Basophils Relative: 0.3 %
Eosinophils Absolute: 168 {cells}/uL (ref 15–500)
Eosinophils Relative: 2.3 %
HCT: 41.5 % (ref 35.0–45.0)
Hemoglobin: 13.5 g/dL (ref 11.7–15.5)
MCH: 29 pg (ref 27.0–33.0)
MCHC: 32.5 g/dL (ref 32.0–36.0)
MCV: 89.2 fL (ref 80.0–100.0)
MPV: 9.4 fL (ref 7.5–12.5)
Monocytes Relative: 11.8 %
Neutro Abs: 4847 {cells}/uL (ref 1500–7800)
Neutrophils Relative %: 66.4 %
Platelets: 360 10*3/uL (ref 140–400)
RBC: 4.65 10*6/uL (ref 3.80–5.10)
RDW: 13.1 % (ref 11.0–15.0)
Total Lymphocyte: 19.2 %
WBC: 7.3 10*3/uL (ref 3.8–10.8)

## 2023-09-26 LAB — MICROALBUMIN / CREATININE URINE RATIO
Creatinine, Urine: 152 mg/dL (ref 20–275)
Microalb Creat Ratio: 23 mg/g{creat} (ref <30)
Microalb, Ur: 3.5 mg/dL

## 2023-09-26 LAB — LIPID PANEL
Cholesterol: 188 mg/dL (ref <200)
HDL: 56 mg/dL (ref >=50)
LDL Cholesterol (Calc): 102 mg/dL — ABNORMAL HIGH
Non-HDL Cholesterol (Calc): 132 mg/dL — ABNORMAL HIGH (ref <130)
Total CHOL/HDL Ratio: 3.4 (calc) (ref <5.0)
Triglycerides: 178 mg/dL — ABNORMAL HIGH (ref <150)

## 2023-09-26 LAB — TSH: TSH: 1.36 m[IU]/L (ref 0.40–4.50)

## 2023-09-26 LAB — VITAMIN D 25 HYDROXY (VIT D DEFICIENCY, FRACTURES): Vit D, 25-Hydroxy: 37 ng/mL (ref 30–100)

## 2023-09-27 ENCOUNTER — Ambulatory Visit: Payer: Self-pay | Admitting: Family Medicine

## 2023-09-27 ENCOUNTER — Other Ambulatory Visit: Payer: Self-pay | Admitting: Family Medicine

## 2023-09-27 MED ORDER — ROSUVASTATIN CALCIUM 40 MG PO TABS
40.0000 mg | ORAL_TABLET | Freq: Every day | ORAL | 1 refills | Status: AC
Start: 2023-09-27 — End: ?

## 2023-10-20 ENCOUNTER — Other Ambulatory Visit: Payer: Self-pay | Admitting: Family Medicine

## 2023-10-20 DIAGNOSIS — I10 Essential (primary) hypertension: Secondary | ICD-10-CM

## 2023-11-05 ENCOUNTER — Encounter: Payer: Self-pay | Admitting: Family Medicine

## 2023-11-05 ENCOUNTER — Telehealth: Payer: Self-pay

## 2023-11-05 ENCOUNTER — Ambulatory Visit (INDEPENDENT_AMBULATORY_CARE_PROVIDER_SITE_OTHER): Payer: MEDICAID | Admitting: Family Medicine

## 2023-11-05 VITALS — BP 132/63 | HR 77 | Temp 97.7°F | Ht 63.0 in | Wt 141.2 lb

## 2023-11-05 DIAGNOSIS — L589 Radiodermatitis, unspecified: Secondary | ICD-10-CM | POA: Diagnosis not present

## 2023-11-05 DIAGNOSIS — I10 Essential (primary) hypertension: Secondary | ICD-10-CM

## 2023-11-05 DIAGNOSIS — Z0001 Encounter for general adult medical examination with abnormal findings: Secondary | ICD-10-CM | POA: Diagnosis not present

## 2023-11-05 DIAGNOSIS — F17200 Nicotine dependence, unspecified, uncomplicated: Secondary | ICD-10-CM | POA: Diagnosis not present

## 2023-11-05 DIAGNOSIS — E119 Type 2 diabetes mellitus without complications: Secondary | ICD-10-CM | POA: Diagnosis not present

## 2023-11-05 DIAGNOSIS — F418 Other specified anxiety disorders: Secondary | ICD-10-CM

## 2023-11-05 DIAGNOSIS — E782 Mixed hyperlipidemia: Secondary | ICD-10-CM

## 2023-11-05 DIAGNOSIS — Z0000 Encounter for general adult medical examination without abnormal findings: Secondary | ICD-10-CM

## 2023-11-05 MED ORDER — BUSPIRONE HCL 10 MG PO TABS: 15.0000 mg | ORAL_TABLET | Freq: Two times a day (BID) | ORAL | 1 refills | Status: DC

## 2023-11-05 MED ORDER — ALPRAZOLAM 0.5 MG PO TABS
0.5000 mg | ORAL_TABLET | Freq: Every day | ORAL | 0 refills | Status: DC | PRN
Start: 2023-11-05 — End: 2023-12-10

## 2023-11-05 NOTE — Assessment & Plan Note (Signed)
 GAD 2. Anhedonia is primary symptom. Continue Buspar  to 15mg  BID. Denies SI/HI. Xanax  sparingly to prevent dependence. Pt is aware of risks of psychoactive medication use to include increased sedation, respiratory suppression, falls, extrapyramidal movements, dependence and cardiovascular events. Pt would like to continue treatment as benefit determined to outweigh risk. PDMP reviewed. Controlled substance agreement signed previously. Follow up in 3 months.

## 2023-11-05 NOTE — Assessment & Plan Note (Signed)
 Chronic well controlled on amlodipine  10mg  daily. Recommend heart healthy diet such as Mediterranean diet with whole grains, fruits, vegetable, fish, lean meats, nuts, and olive oil. Limit salt. Encouraged moderate walking, 3-5 times/week for 30-50 minutes each session. Aim for at least 150 minutes.week. Goal should be pace of 3 miles/hours, or walking 1.5 miles in 30 minutes. Avoid tobacco products. Avoid excess alcohol. Take medications as prescribed and bring medications and blood pressure log with cuff to each office visit. Seek medical care for chest pain, palpitations, shortness of breath with exertion, dizziness/lightheadedness, vision changes, recurrent headaches, or swelling of extremities. Labs today

## 2023-11-05 NOTE — Assessment & Plan Note (Signed)
 A1c 6.5% from 6.2%, advised lifestyle modifications. Discussed recommended nutrition and exercise. Advised to monitor daily fasting BG and report if sustains >120. uACR done. Foot exam today. Vaccines declined. Retinal eye exam done. Recommend heart healthy diet such as Mediterranean diet with whole grains, fruits, vegetable, fish, lean meats, nuts, and olive oil. Limit salt. Encouraged moderate walking, 3-5 times/week for 30-50 minutes each session. Aim for at least 150 minutes.week. Goal should be pace of 3 miles/hours, or walking 1.5 miles in 30 minutes. Seek medical care for urinary frequency, extreme thirst, vision changes, lightheadedness, dizziness.  Follow up in 3 months

## 2023-11-05 NOTE — Assessment & Plan Note (Signed)
 3-5 minute discussion regarding the harms of tobacco use, the benefits of cessation, and methods of cessation. Discussed that there are medication options to help with cessation. Provided printed education on steps to quit smoking.

## 2023-11-05 NOTE — Progress Notes (Signed)
 Complete physical exam  Patient: Bethany Mills   DOB: [birth year]   63 y.o. Female  MRN: [id number]  Subjective:    Chief Complaint  Patient presents with   Annual Exam    Physical   Medication refills,  Request something for nausea when taking morning medication     Bethany Mills is a 63 y.o. female who presents today for a complete physical exam. She reports consuming a general diet. Exercise consists of staying active with grandkids and yard work She generally feels well. She reports sleeping well. She does not have additional problems to discuss today.   Nicotine dependence: 0.5ppd, encouraged cessation, working on quitting  DM2: diet controlled, A1c 6.5%, foot exam today, gets eye exams at [facility]  HLD: repeat labs today, unsure if she has increased Rosuvastatin  to 40mg  daily, heart healthy diet and exercise when able  Anxiety: well controlled on Buspar  15mg  BID and PRN Xanax , she is using this sparingly, discussed risks and dependence potential  Left breast cancer: followed by Oncology, [facility], s/p lumpectomy and radiation, Anastrozole  x7y, 10/2023 due for mammogram, DEXA scheduled for 02/2024  Radiation Dermatitis with Id reaction: well controlled with Clobetasol  0.5% ointment BID PRN     09/24/2023    2:12 PM 02/14/2023    3:27 PM 12/04/2022    3:45 PM 11/21/2022    9:15 AM  GAD 7 : Generalized Anxiety Score  Nervous, Anxious, on Edge 0 3 3 3   Control/stop worrying 0 3 1 3   Worry too much - different things  1 0 3  Trouble relaxing   0 3  Restless  1 0 3  Easily annoyed or irritable  0 0 0  Afraid - awful might happen  0 0 3  Total GAD 7 Score   4 18  Anxiety Difficulty Not difficult at all Somewhat difficult        Most recent fall risk assessment:    09/24/2023    2:12 PM  Fall Risk   Falls in the past year? 0  Number falls in past yr: 0  Injury with Fall? 0  Risk for fall due to : No Fall Risks  Follow up Falls evaluation completed     Most  recent depression screenings:    09/24/2023    2:12 PM 08/24/2023    2:54 PM  PHQ 2/9 Scores  PHQ - 2 Score 2 0  PHQ- 9 Score 2     Vision:Within last year and Dental: No current dental problems and Receives regular dental care  Patient Active Problem List   Diagnosis Date Noted   Controlled type 2 diabetes mellitus without complication, without long-term current use of insulin (HCC) 09/24/2023   Primary hypertension 09/24/2023   Dyshidrotic eczema 02/07/2023   Dermatitis 12/04/2022   Situational anxiety 11/21/2022   Malignant neoplasm of upper-outer quadrant of left breast in female, estrogen receptor positive (HCC) 11/20/2022   Physical exam, annual 10/11/2022   Mixed hyperlipidemia 10/11/2022   DDD (degenerative disc disease), cervical 02/21/2013   Tobacco dependence 02/21/2013   POST-TRAUMATIC HEADACHE UNSPECIFIED 03/08/2010   CERVICAL STRAIN 03/08/2010   WEAKNESS 12/20/2009   PELVIC  PAIN 12/20/2009   History of colonic polyps 12/20/2009   Past Medical History:  Diagnosis Date   Allergies    Anxiety    COPD (chronic obstructive pulmonary disease) (HCC)    Hyperglycemic coma (HCC)    Hypertension    Past Surgical History:  Procedure Laterality Date   ABDOMINAL  HYSTERECTOMY     BREAST BIOPSY Left 11/13/2022   US  LT BREAST BX W LOC DEV 1ST LESION IMG BX SPEC US  GUIDE 11/13/2022 [facility]   BREAST BIOPSY  12/05/2022   MM LT RADIOACTIVE SEED LOC MAMMO GUIDE 12/05/2022 [facility]   BREAST LUMPECTOMY WITH RADIOACTIVE SEED AND SENTINEL LYMPH NODE BIOPSY Left 12/06/2022   Procedure: LEFT BREAST SEED GUIDED LUMPECTOMY, LEFT AXILLARY SENTINEL NODE BIOPSY;  Surgeon: Ebbie Cough, MD;  Location: [facility];  Service: General;  Laterality: Left;  PEC BLOCK   Social History   Tobacco Use   Smoking status: Every Day    Current packs/day: 1.00    Average packs/day: 1 pack/day for 15.0 years (15.0 ttl pk-yrs)    Types: Cigarettes   Smokeless  tobacco: Never  Vaping Use   Vaping status: Never Used  Substance Use Topics   Alcohol use: Not Currently    Comment: beer-1-5 beers a day   Drug use: Yes    Types: Marijuana    Comment: yesterday last dose 12/05/22   Family History  Problem Relation Age of Onset   Heart disease Mother    Hypertension Mother    COPD Mother    Cancer Mother    Breast cancer Mother    Heart disease Father    Hypertension Father    Cancer Maternal Grandmother    Diabetes Paternal Grandmother    Heart disease Paternal Grandfather    Allergies  Allergen Reactions   Codeine Nausea And Vomiting   Neomycin-Polymyxin B Gu Other (See Comments)    Causes eyes to swell, run and crust over.    Neomycin-Bacitracin Zn-Polymyx Hives and Rash      Patient Care Team: Kayla Jeoffrey RAMAN, FNP as PCP - General (Family Medicine) Ebbie Cough, MD as Consulting Physician (General Surgery) Izell Domino, MD as Consulting Physician (Radiation Oncology) Odean Potts, MD as Consulting Physician (Hematology and Oncology)   Outpatient Medications Prior to Visit  Medication Sig   amLODipine  (NORVASC ) 10 MG tablet TAKE 1 TABLET BY MOUTH EVERY DAY   anastrozole  (ARIMIDEX ) 1 MG tablet Take 1 tablet (1 mg total) by mouth daily.   aspirin  81 MG chewable tablet Chew 1 tablet (81 mg total) by mouth daily.   clobetasol  ointment (TEMOVATE ) 0.05 % Apply 1 Application topically 2 (two) times daily. Apple 2x per day on hands   rosuvastatin  (CRESTOR ) 40 MG tablet Take 1 tablet (40 mg total) by mouth daily.   triamcinolone  ointment (KENALOG ) 0.1 % Apply 1 Application topically 2 (two) times daily. Apply 2x daily to the body.   [DISCONTINUED] ALPRAZolam  (XANAX ) 0.5 MG tablet Take 1 tablet (0.5 mg total) by mouth 2 (two) times daily as needed for anxiety.   [DISCONTINUED] busPIRone  (BUSPAR ) 10 MG tablet Take 1.5 tablets (15 mg total) by mouth 2 (two) times daily.   No facility-administered medications prior to visit.     Review of Systems  Constitutional: Negative.   HENT: Negative.    Eyes: Negative.   Respiratory: Negative.    Cardiovascular: Negative.   Gastrointestinal: Negative.   Genitourinary: Negative.   Musculoskeletal: Negative.   Skin: Negative.   Neurological: Negative.   Endo/Heme/Allergies: Negative.   Psychiatric/Behavioral: Negative.    All other systems reviewed and are negative.         Objective:     BP 132/63   Pulse 77   Temp 97.7 F (36.5 C)   Ht 5' 3 (1.6 m)   Wt 141  lb 3.2 oz (64 kg)   SpO2 98%   BMI 25.01 kg/m  BP Readings from Last 3 Encounters:  11/05/23 132/63  09/24/23 125/71  08/24/23 132/62   Wt Readings from Last 3 Encounters:  11/05/23 141 lb 3.2 oz (64 kg)  09/24/23 140 lb 0.6 oz (63.5 kg)  08/24/23 141 lb 3.2 oz (64 kg)      Physical Exam Vitals and nursing note reviewed.  Constitutional:      Appearance: Normal appearance. She is normal weight.  HENT:     Head: Normocephalic and atraumatic.     Right Ear: Tympanic membrane, ear canal and external ear normal.     Left Ear: Tympanic membrane, ear canal and external ear normal.     Nose: Nose normal.     Mouth/Throat:     Mouth: Mucous membranes are moist.     Dentition: Has dentures.     Pharynx: Oropharynx is clear.   Eyes:     Extraocular Movements: Extraocular movements intact.     Conjunctiva/sclera: Conjunctivae normal.     Pupils: Pupils are equal, round, and reactive to light.    Cardiovascular:     Rate and Rhythm: Normal rate and regular rhythm.     Pulses: Normal pulses.     Heart sounds: Normal heart sounds.  Pulmonary:     Effort: Pulmonary effort is normal.     Breath sounds: Normal breath sounds.  Abdominal:     General: Bowel sounds are normal.     Palpations: Abdomen is soft.   Musculoskeletal:        General: Normal range of motion.     Cervical back: Normal range of motion and neck supple.   Skin:    General: Skin is warm and dry.     Capillary  Refill: Capillary refill takes less than 2 seconds.   Neurological:     General: No focal deficit present.     Mental Status: She is alert and oriented to person, place, and time. Mental status is at baseline.   Psychiatric:        Mood and Affect: Mood normal.        Behavior: Behavior normal.        Thought Content: Thought content normal.        Judgment: Judgment normal.     Diabetic Foot Exam - Simple   Simple Foot Form Visual Inspection No deformities, no ulcerations, no other skin breakdown bilaterally: Yes Sensation Testing Intact to touch and monofilament testing bilaterally: Yes Pulse Check Posterior Tibialis and Dorsalis pulse intact bilaterally: Yes Comments      No results found for any visits on 11/05/23. Last CBC Lab Results  Component Value Date   WBC 7.3 09/24/2023   HGB 13.5 09/24/2023   HCT 41.5 09/24/2023   MCV 89.2 09/24/2023   MCH 29.0 09/24/2023   RDW 13.1 09/24/2023   PLT 360 09/24/2023   Last metabolic panel Lab Results  Component Value Date   GLUCOSE 190 (H) 08/24/2023   NA 140 08/24/2023   K 4.2 08/24/2023   CL 104 08/24/2023   CO2 26 08/24/2023   BUN 11 08/24/2023   CREATININE 0.61 08/24/2023   GFRNONAA >60 11/22/2022   CALCIUM  9.9 08/24/2023   PROT 8.1 11/22/2022   ALBUMIN 4.5 11/22/2022   BILITOT 0.4 11/22/2022   ALKPHOS 114 11/22/2022   AST 26 11/22/2022   ALT 29 11/22/2022   ANIONGAP 12 11/22/2022   Last lipids Lab Results  Component Value Date   CHOL 188 09/24/2023   HDL 56 09/24/2023   LDLCALC 102 (H) 09/24/2023   TRIG 178 (H) 09/24/2023   CHOLHDL 3.4 09/24/2023   Last hemoglobin A1c Lab Results  Component Value Date   HGBA1C 6.5 (H) 08/24/2023   Last thyroid functions Lab Results  Component Value Date   TSH 1.36 09/24/2023   Last vitamin D  Lab Results  Component Value Date   VD25OH 37 09/24/2023   Last vitamin B12 and Folate No results found for: VITAMINB12, FOLATE      Assessment & Plan:     Routine Health Maintenance and Physical Exam   There is no immunization history on file for this patient.  Health Maintenance  Topic Date Due   OPHTHALMOLOGY EXAM  11/05/2023 (Originally 02/20/1971)   Zoster Vaccines- Shingrix (1 of 2) 12/25/2023 (Originally 02/20/1978)   Pneumococcal Vaccine 28-53 Years old (1 of 2 - PCV) 09/23/2024 (Originally 02/20/1978)   Fecal DNA (Cologuard)  09/23/2024 (Originally 02/21/2004)   INFLUENZA VACCINE  12/07/2023   HEMOGLOBIN A1C  02/23/2024   Diabetic kidney evaluation - eGFR measurement  08/23/2024   Diabetic kidney evaluation - Urine ACR  09/23/2024   MAMMOGRAM  10/30/2024   FOOT EXAM  11/04/2024   Cervical Cancer Screening (HPV/Pap Cotest)  10/11/2027   Hepatitis C Screening  Completed   HIV Screening  Completed   Hepatitis B Vaccines  Aged Out   HPV VACCINES  Aged Out   Meningococcal B Vaccine  Aged Out   DTaP/Tdap/Td  Discontinued   COVID-19 Vaccine  Discontinued    Discussed health benefits of physical activity, and encouraged her to engage in regular exercise appropriate for her age and condition.  Problem List Items Addressed This Visit     Tobacco dependence   3-5 minute discussion regarding the harms of tobacco use, the benefits of cessation, and methods of cessation. Discussed that there are medication options to help with cessation. Provided printed education on steps to quit smoking.       Physical exam, annual - Primary   Today your medical history was reviewed and routine physical exam with labs was performed. Recommend 150 minutes of moderate intensity exercise weekly and consuming a well-balanced diet. Advised to stop smoking if a smoker, avoid smoking if a non-smoker, limit alcohol consumption to 1 drink per day for women and 2 drinks per day for men, and avoid illicit drug use. Counseled on the importance of sunscreen use. Counseled in mental health awareness and when to seek medical care. Vaccine maintenance discussed.  Appropriate health maintenance items reviewed. Return to office in 1 year for annual physical exam.       Relevant Orders   Comprehensive metabolic panel with GFR   Lipid panel   Microalbumin / creatinine urine ratio   Mixed hyperlipidemia   Labs today. Continue crestor  40mg  daily. LDL goal <70. Your labs showed elevated cholesterol. I recommend consuming a heart healthy diet such as Mediterranean diet or DASH diet with whole grains, fruits, vegetable, fish, lean meats, nuts, and olive oil. Limit sweets and processed foods. I also encourage moderate intensity exercise 150 minutes weekly. This is 3-5 times weekly for 30-50 minutes each session. Goal should be pace of 3 miles/hours, or walking 1.5 miles in 30 minutes. The 10-year ASCVD risk score (Arnett DK, et al., 2019) is: 23.1%       Relevant Orders   Comprehensive metabolic panel with GFR   Lipid panel   Situational anxiety  GAD 2. Anhedonia is primary symptom. Continue Buspar  to 15mg  BID. Denies SI/HI. Xanax  sparingly to prevent dependence. Pt is aware of risks of psychoactive medication use to include increased sedation, respiratory suppression, falls, extrapyramidal movements, dependence and cardiovascular events. Pt would like to continue treatment as benefit determined to outweigh risk. PDMP reviewed. Controlled substance agreement signed previously. Follow up in 3 months.       Relevant Medications   busPIRone  (BUSPAR ) 10 MG tablet   ALPRAZolam  (XANAX ) 0.5 MG tablet   Controlled type 2 diabetes mellitus without complication, without long-term current use of insulin (HCC)   A1c 6.5% from 6.2%, advised lifestyle modifications. Discussed recommended nutrition and exercise. Advised to monitor daily fasting BG and report if sustains >120. uACR done. Foot exam today. Vaccines declined. Retinal eye exam done. Recommend heart healthy diet such as Mediterranean diet with whole grains, fruits, vegetable, fish, lean meats, nuts, and olive  oil. Limit salt. Encouraged moderate walking, 3-5 times/week for 30-50 minutes each session. Aim for at least 150 minutes.week. Goal should be pace of 3 miles/hours, or walking 1.5 miles in 30 minutes. Seek medical care for urinary frequency, extreme thirst, vision changes, lightheadedness, dizziness.  Follow up in 3 months      Relevant Orders   Comprehensive metabolic panel with GFR   Lipid panel   Microalbumin / creatinine urine ratio   Primary hypertension   Chronic well controlled on amlodipine  10mg  daily. Recommend heart healthy diet such as Mediterranean diet with whole grains, fruits, vegetable, fish, lean meats, nuts, and olive oil. Limit salt. Encouraged moderate walking, 3-5 times/week for 30-50 minutes each session. Aim for at least 150 minutes.week. Goal should be pace of 3 miles/hours, or walking 1.5 miles in 30 minutes. Avoid tobacco products. Avoid excess alcohol. Take medications as prescribed and bring medications and blood pressure log with cuff to each office visit. Seek medical care for chest pain, palpitations, shortness of breath with exertion, dizziness/lightheadedness, vision changes, recurrent headaches, or swelling of extremities. Labs today      Other Visit Diagnoses       Radiation dermatitis          Return in about 4 months (around 03/06/2024) for AWV, chronic follow-up with labs 1 week prior.     Jeoffrey GORMAN Barrio, FNP

## 2023-11-05 NOTE — Assessment & Plan Note (Signed)
Today your medical history was reviewed and routine physical exam with labs was performed. Recommend 150 minutes of moderate intensity exercise weekly and consuming a well-balanced diet. Advised to stop smoking if a smoker, avoid smoking if a non-smoker, limit alcohol consumption to 1 drink per day for women and 2 drinks per day for men, and avoid illicit drug use. Counseled on the importance of sunscreen use. Counseled in mental health awareness and when to seek medical care. Vaccine maintenance discussed. Appropriate health maintenance items reviewed. Return to office in 1 year for annual physical exam.

## 2023-11-05 NOTE — Telephone Encounter (Signed)
 Copied from CRM [id number]. Topic: Clinical - Medication Question >> Nov 05, 2023  5:00 PM DeAngela L wrote: Reason for CRM: patient calling to let the provider know that her rosuvastatin  is a 40 MG tablet Pt num [phone] (M)

## 2023-11-05 NOTE — Assessment & Plan Note (Signed)
 Labs today. Continue crestor  40mg  daily. LDL goal <70. Your labs showed elevated cholesterol. I recommend consuming a heart healthy diet such as Mediterranean diet or DASH diet with whole grains, fruits, vegetable, fish, lean meats, nuts, and olive oil. Limit sweets and processed foods. I also encourage moderate intensity exercise 150 minutes weekly. This is 3-5 times weekly for 30-50 minutes each session. Goal should be pace of 3 miles/hours, or walking 1.5 miles in 30 minutes. The 10-year ASCVD risk score (Arnett DK, et al., 2019) is: 23.1%

## 2023-11-06 ENCOUNTER — Other Ambulatory Visit: Payer: Self-pay | Admitting: Family Medicine

## 2023-11-06 DIAGNOSIS — E119 Type 2 diabetes mellitus without complications: Secondary | ICD-10-CM

## 2023-11-06 DIAGNOSIS — E782 Mixed hyperlipidemia: Secondary | ICD-10-CM

## 2023-11-06 LAB — COMPREHENSIVE METABOLIC PANEL WITH GFR
AG Ratio: 1.7 (calc) (ref 1.0–2.5)
ALT: 22 U/L (ref 6–29)
AST: 15 U/L (ref 10–35)
Albumin: 4.8 g/dL (ref 3.6–5.1)
Alkaline phosphatase (APISO): 105 U/L (ref 37–153)
BUN: 15 mg/dL (ref 7–25)
CO2: 29 mmol/L (ref 20–32)
Calcium: 9.8 mg/dL (ref 8.6–10.4)
Chloride: 104 mmol/L (ref 98–110)
Creat: 0.7 mg/dL (ref 0.50–1.05)
Globulin: 2.8 g/dL (ref 1.9–3.7)
Glucose, Bld: 131 mg/dL — ABNORMAL HIGH (ref 65–99)
Potassium: 3.8 mmol/L (ref 3.5–5.3)
Sodium: 140 mmol/L (ref 135–146)
Total Bilirubin: 0.3 mg/dL (ref 0.2–1.2)
Total Protein: 7.6 g/dL (ref 6.1–8.1)
eGFR: 97 mL/min/{1.73_m2} (ref >=60)

## 2023-11-06 LAB — EXTRA LAV TOP TUBE

## 2023-11-06 LAB — MICROALBUMIN / CREATININE URINE RATIO
Creatinine, Urine: 67 mg/dL (ref 20–275)
Microalb Creat Ratio: 243 mg/g{creat} — ABNORMAL HIGH (ref <30)
Microalb, Ur: 16.3 mg/dL

## 2023-11-06 LAB — LIPID PANEL
Cholesterol: 159 mg/dL (ref <200)
HDL: 49 mg/dL — ABNORMAL LOW (ref >=50)
LDL Cholesterol (Calc): 77 mg/dL
Non-HDL Cholesterol (Calc): 110 mg/dL (ref <130)
Total CHOL/HDL Ratio: 3.2 (calc) (ref <5.0)
Triglycerides: 254 mg/dL — ABNORMAL HIGH (ref <150)

## 2023-11-22 ENCOUNTER — Telehealth: Payer: Self-pay | Admitting: Hematology and Oncology

## 2023-11-22 NOTE — Telephone Encounter (Signed)
 Spoke with patient confirming upcoming appointment changes

## 2023-12-10 ENCOUNTER — Other Ambulatory Visit: Payer: Self-pay | Admitting: Family Medicine

## 2023-12-19 ENCOUNTER — Ambulatory Visit: Payer: MEDICAID | Admitting: Hematology and Oncology

## 2023-12-19 ENCOUNTER — Ambulatory Visit
Admission: RE | Admit: 2023-12-19 | Discharge: 2023-12-19 | Disposition: A | Discharge status: Home / Self Care | Payer: MEDICAID | Source: Ambulatory Visit | Attending: Adult Health | Admitting: Adult Health

## 2023-12-19 DIAGNOSIS — Z170 Estrogen receptor positive status [ER+]: Secondary | ICD-10-CM

## 2023-12-19 DIAGNOSIS — C50412 Malignant neoplasm of upper-outer quadrant of left female breast: Secondary | ICD-10-CM

## 2023-12-19 HISTORY — DX: Personal history of irradiation: Z92.3

## 2024-01-01 ENCOUNTER — Inpatient Hospital Stay: Payer: MEDICAID | Attending: Hematology and Oncology | Admitting: Hematology and Oncology

## 2024-01-01 VITALS — BP 126/64 | HR 75 | Temp 98.2°F | Resp 18 | Wt 140.4 lb

## 2024-01-01 DIAGNOSIS — Z170 Estrogen receptor positive status [ER+]: Secondary | ICD-10-CM | POA: Diagnosis not present

## 2024-01-01 DIAGNOSIS — Z79899 Other long term (current) drug therapy: Secondary | ICD-10-CM | POA: Diagnosis not present

## 2024-01-01 DIAGNOSIS — Z79811 Long term (current) use of aromatase inhibitors: Secondary | ICD-10-CM | POA: Diagnosis not present

## 2024-01-01 DIAGNOSIS — Z1732 Human epidermal growth factor receptor 2 negative status: Secondary | ICD-10-CM | POA: Diagnosis not present

## 2024-01-01 DIAGNOSIS — C50412 Malignant neoplasm of upper-outer quadrant of left female breast: Secondary | ICD-10-CM | POA: Diagnosis not present

## 2024-01-01 DIAGNOSIS — Z1721 Progesterone receptor positive status: Secondary | ICD-10-CM | POA: Diagnosis not present

## 2024-01-01 DIAGNOSIS — E119 Type 2 diabetes mellitus without complications: Secondary | ICD-10-CM | POA: Insufficient documentation

## 2024-01-01 NOTE — Progress Notes (Signed)
 Patient Care Team: Kayla Jeoffrey RAMAN, FNP as PCP - General (Family Medicine) Ebbie Cough, MD as Consulting Physician (General Surgery) Izell Domino, MD as Consulting Physician (Radiation Oncology) Odean Potts, MD as Consulting Physician (Hematology and Oncology)  DIAGNOSIS:  Encounter Diagnosis  Name Primary?   Malignant neoplasm of upper-outer quadrant of left breast in female, estrogen receptor positive (HCC) Yes    SUMMARY OF ONCOLOGIC HISTORY: Oncology History  Malignant neoplasm of upper-outer quadrant of left breast in female, estrogen receptor positive (HCC)  11/13/2022 Initial Diagnosis   Screening mammogram detected left breast mass and distortion at 12 o'clock position measuring 1.2 cm, axilla negative, biopsy revealed grade 1 IDC with DCIS ER 100%, PR 85%, Ki67 10%, HER2 negative 1+   11/22/2022 Cancer Staging   Staging form: Breast, AJCC 8th Edition - Clinical stage from 11/22/2022: Stage IA (cT1c, cN0, cM0, G1, ER+, PR+, HER2-) - Signed by Odean Potts, MD on 11/22/2022 Stage prefix: Initial diagnosis Histologic grading system: 3 grade system Laterality: Left Staged by: Pathologist and managing physician National guidelines used in treatment planning: Yes Type of national guideline used in treatment planning: NCCN   12/06/2022 Surgery   Left lumpectomy: Grade 2 IDC 1.1 cm, margins negative, 1/1 lymph node positive with extranodal extension ER 100%, PR 85%, HER2 1+ negative, Ki-67 10%   01/24/2023 - 02/27/2023 Radiation Therapy   Plan Name: Breast_L_BH Site: Breast, Left Technique: 3D Mode: Photon Dose Per Fraction: 2 Gy Prescribed Dose (Delivered / Prescribed): 50 Gy / 50 Gy Prescribed Fxs (Delivered / Prescribed): 25 / 25   Plan Name: Brst_L_SCV_BH Site: Breast, Left Technique: 3D Mode: Photon Dose Per Fraction: 2 Gy Prescribed Dose (Delivered / Prescribed): 50 Gy / 50 Gy Prescribed Fxs (Delivered / Prescribed): 25 / 25   03/20/2023 -  Anti-estrogen  oral therapy   Anastrozole  x 7 years   03/20/2023 Cancer Staging   Staging form: Breast, AJCC 8th Edition - Pathologic: Stage IA (pT1c, pN1, cM0, G2, ER+, PR+, HER2-) - Signed by Odean Potts, MD on 03/20/2023 Histologic grading system: 3 grade system     CHIEF COMPLIANT: Surveillance of breast cancer on anastrozole  therapy  HISTORY OF PRESENT ILLNESS:   History of Present Illness Bethany Mills is a 63 year old female with diabetes and a history of breast cancer who presents for a follow-up visit.  She is taking anastrozole  with occasional nocturnal hot flashes but no significant side effects. There is no breast pain or discomfort. Her hands have improved since breaking out post-radiation therapy, and she uses moisturizers for management. She experiences morning joint stiffness that improves with movement.     ALLERGIES:  is allergic to codeine, neomycin-polymyxin b gu, and neomycin-bacitracin zn-polymyx.  MEDICATIONS:  Current Outpatient Medications  Medication Sig Dispense Refill   ALPRAZolam  (XANAX ) 0.5 MG tablet TAKE 1 TABLET BY MOUTH EVERY DAY AS NEEDED FOR ANXIETY 30 tablet 0   amLODipine  (NORVASC ) 10 MG tablet TAKE 1 TABLET BY MOUTH EVERY DAY 90 tablet 2   anastrozole  (ARIMIDEX ) 1 MG tablet Take 1 tablet (1 mg total) by mouth daily. 90 tablet 3   aspirin  81 MG chewable tablet Chew 1 tablet (81 mg total) by mouth daily. 30 tablet 0   busPIRone  (BUSPAR ) 10 MG tablet Take 1.5 tablets (15 mg total) by mouth 2 (two) times daily. 90 tablet 1   clobetasol  ointment (TEMOVATE ) 0.05 % Apply 1 Application topically 2 (two) times daily. Apple 2x per day on hands 30 g 2  rosuvastatin  (CRESTOR ) 40 MG tablet Take 1 tablet (40 mg total) by mouth daily. 90 tablet 1   triamcinolone  ointment (KENALOG ) 0.1 % Apply 1 Application topically 2 (two) times daily. Apply 2x daily to the body. 453 g 2   No current facility-administered medications for this visit.    PHYSICAL EXAMINATION: ECOG  PERFORMANCE STATUS: 1 - Symptomatic but completely ambulatory  Vitals:   01/01/24 1429  BP: 126/64  Pulse: 75  Resp: 18  Temp: 98.2 F (36.8 C)  SpO2: 96%   Filed Weights   01/01/24 1429  Weight: 140 lb 6.4 oz (63.7 kg)    Physical Exam   (exam performed in the presence of a chaperone)  LABORATORY DATA:  I have reviewed the data as listed    Latest Ref Rng & Units 11/05/2023    4:26 PM 08/24/2023    3:07 PM 11/22/2022   12:21 PM  CMP  Glucose 65 - 99 mg/dL 868  809  826   BUN 7 - 25 mg/dL 15  11  9    Creatinine 0.50 - 1.05 mg/dL 9.29  9.38  9.21   Sodium 135 - 146 mmol/L 140  140  140   Potassium 3.5 - 5.3 mmol/L 3.8  4.2  3.4   Chloride 98 - 110 mmol/L 104  104  103   CO2 20 - 32 mmol/L 29  26  25    Calcium  8.6 - 10.4 mg/dL 9.8  9.9  9.8   Total Protein 6.1 - 8.1 g/dL 7.6   8.1   Total Bilirubin 0.2 - 1.2 mg/dL 0.3   0.4   Alkaline Phos 38 - 126 U/L   114   AST 10 - 35 U/L 15   26   ALT 6 - 29 U/L 22   29     Lab Results  Component Value Date   WBC 7.3 09/24/2023   HGB 13.5 09/24/2023   HCT 41.5 09/24/2023   MCV 89.2 09/24/2023   PLT 360 09/24/2023   NEUTROABS 4,847 09/24/2023    ASSESSMENT & PLAN:  Malignant neoplasm of upper-outer quadrant of left breast in female, estrogen receptor positive (HCC) 12/06/2022: Left lumpectomy: Grade 2 IDC 1.1 cm, margins negative, 1/1 lymph node positive with extranodal extension ER 100%, PR 85%, HER2 1+ negative, Ki-67 10%   Treatment plan: Will not performing Oncotype DX testing because patient does not have any interest in chemo. Adjuvant radiation 01/24/2023-02/27/2023 Adjuvant antiestrogen therapy with anastrozole  1 mg daily x 5 years started 03/20/2023   Anastrozole  Toxicities: Joint stiffness when she wakes up Occasional hot flashes at nighttime  Breast Cancer Surveillance: Mammograms: 12/19/23: Benign, density Cat C Bone density has been scheduled for October  RTC in 1  year ------------------------------------- Assessment and Plan Assessment & Plan Estrogen receptor positive breast cancer, left upper-outer quadrant On anastrozole  with minimal side effects. Mammograms show effective treatment. - Continue anastrozole  1 MG oral daily. - Schedule bone density test in October. - No whole body scans or unnecessary blood work. - Annual check-up after mammogram for five years.  Type 2 diabetes mellitus without complications Weight loss of three pounds achieved. Lack of regular exercise noted. - Encourage regular walking, especially in the evening for at least 30 minutes. - Discussed benefits of exercise, including improved sleep, lower blood sugar, and reduced breast cancer risk.      No orders of the defined types were placed in this encounter.  The patient has a good understanding of the overall plan.  she agrees with it. she will call with any problems that may develop before the next visit here. Total time spent: 30 mins including face to face time and time spent for planning, charting and co-ordination of care   Bethany MARLA Chad, MD 01/01/24

## 2024-01-01 NOTE — Assessment & Plan Note (Signed)
 12/06/2022: Left lumpectomy: Grade 2 IDC 1.1 cm, margins negative, 1/1 lymph node positive with extranodal extension ER 100%, PR 85%, HER2 1+ negative, Ki-67 10%   Treatment plan: Will not performing Oncotype DX testing because patient does not have any interest in chemo. Adjuvant radiation 01/24/2023-02/27/2023 Adjuvant antiestrogen therapy with anastrozole  1 mg daily x 5 years started 03/20/2023   Anastrozole  Toxicities:   Breast Cancer Surveillance: Mammograms: 12/19/23: Benign, density Cat C Breast Exam: 01/01/24: benign  RTC in 1 year

## 2024-01-13 ENCOUNTER — Other Ambulatory Visit: Payer: Self-pay | Admitting: Family Medicine

## 2024-02-11 ENCOUNTER — Other Ambulatory Visit: Payer: Self-pay | Admitting: Family Medicine

## 2024-02-14 ENCOUNTER — Other Ambulatory Visit (HOSPITAL_COMMUNITY): Payer: MEDICAID

## 2024-02-14 ENCOUNTER — Other Ambulatory Visit: Payer: MEDICAID

## 2024-02-28 ENCOUNTER — Ambulatory Visit (HOSPITAL_COMMUNITY): Payer: MEDICAID

## 2024-02-28 ENCOUNTER — Other Ambulatory Visit: Payer: MEDICAID

## 2024-02-28 DIAGNOSIS — Z0000 Encounter for general adult medical examination without abnormal findings: Secondary | ICD-10-CM

## 2024-02-28 DIAGNOSIS — E782 Mixed hyperlipidemia: Secondary | ICD-10-CM

## 2024-02-28 DIAGNOSIS — E119 Type 2 diabetes mellitus without complications: Secondary | ICD-10-CM

## 2024-02-29 LAB — LIPID PANEL
Cholesterol: 160 mg/dL (ref <200)
HDL: 57 mg/dL (ref >=50)
LDL Cholesterol (Calc): 82 mg/dL
Non-HDL Cholesterol (Calc): 103 mg/dL (ref <130)
Total CHOL/HDL Ratio: 2.8 (calc) (ref <5.0)
Triglycerides: 114 mg/dL (ref <150)

## 2024-02-29 LAB — MICROALBUMIN / CREATININE URINE RATIO
Creatinine, Urine: 172 mg/dL (ref 20–275)
Microalb Creat Ratio: 272 mg/g{creat} — ABNORMAL HIGH (ref <30)
Microalb, Ur: 46.7 mg/dL

## 2024-02-29 LAB — COMPREHENSIVE METABOLIC PANEL WITH GFR
AG Ratio: 1.8 (calc) (ref 1.0–2.5)
ALT: 21 U/L (ref 6–29)
AST: 24 U/L (ref 10–35)
Albumin: 4.8 g/dL (ref 3.6–5.1)
Alkaline phosphatase (APISO): 97 U/L (ref 37–153)
BUN: 15 mg/dL (ref 7–25)
CO2: 28 mmol/L (ref 20–32)
Calcium: 9.6 mg/dL (ref 8.6–10.4)
Chloride: 103 mmol/L (ref 98–110)
Creat: 0.65 mg/dL (ref 0.50–1.05)
Globulin: 2.7 g/dL (ref 1.9–3.7)
Glucose, Bld: 113 mg/dL — ABNORMAL HIGH (ref 65–99)
Potassium: 4.3 mmol/L (ref 3.5–5.3)
Sodium: 141 mmol/L (ref 135–146)
Total Bilirubin: 0.5 mg/dL (ref 0.2–1.2)
Total Protein: 7.5 g/dL (ref 6.1–8.1)
eGFR: 98 mL/min/1.73m2 (ref >=60)

## 2024-02-29 LAB — HEMOGLOBIN A1C
Hgb A1c MFr Bld: 6.3 % — ABNORMAL HIGH (ref <5.7)
Mean Plasma Glucose: 134 mg/dL
eAG (mmol/L): 7.4 mmol/L

## 2024-03-05 ENCOUNTER — Ambulatory Visit: Payer: Self-pay | Admitting: Family Medicine

## 2024-03-06 ENCOUNTER — Encounter: Payer: Self-pay | Admitting: Family Medicine

## 2024-03-06 ENCOUNTER — Ambulatory Visit: Payer: MEDICAID | Admitting: Family Medicine

## 2024-03-06 VITALS — BP 135/75 | HR 71 | Temp 97.5°F | Ht 63.0 in | Wt 141.6 lb

## 2024-03-06 DIAGNOSIS — Z0001 Encounter for general adult medical examination with abnormal findings: Secondary | ICD-10-CM | POA: Diagnosis not present

## 2024-03-06 DIAGNOSIS — S70922A Unspecified superficial injury of left thigh, initial encounter: Secondary | ICD-10-CM | POA: Diagnosis not present

## 2024-03-06 DIAGNOSIS — I10 Essential (primary) hypertension: Secondary | ICD-10-CM

## 2024-03-06 DIAGNOSIS — Z0000 Encounter for general adult medical examination without abnormal findings: Secondary | ICD-10-CM | POA: Insufficient documentation

## 2024-03-06 DIAGNOSIS — E119 Type 2 diabetes mellitus without complications: Secondary | ICD-10-CM

## 2024-03-06 DIAGNOSIS — E782 Mixed hyperlipidemia: Secondary | ICD-10-CM | POA: Diagnosis not present

## 2024-03-06 DIAGNOSIS — Z23 Encounter for immunization: Secondary | ICD-10-CM

## 2024-03-06 MED ORDER — LISINOPRIL 10 MG PO TABS: 10.0000 mg | ORAL_TABLET | Freq: Every day | ORAL | 0 refills | Status: DC

## 2024-03-06 NOTE — Progress Notes (Signed)
 Subjective:    KABREA SEENEY is a 63 y.o. female who presents for a Welcome to Medicare exam.   Cardiac Risk Factors include: smoking/ tobacco exposure;advanced age (>49men, >23 women)      Objective:    Today's Vitals   03/06/24 1121 03/06/24 1140  BP: 135/75   Pulse: 71   Temp: (!) 97.5 F (36.4 C)   SpO2: 97%   Weight: 141 lb 9.6 oz (64.2 kg)   Height: 5' 3 (1.6 m)   PainSc: 0-No pain 0-No pain  Body mass index is 25.08 kg/m.  Medications Outpatient Encounter Medications as of 03/06/2024  Medication Sig   ALPRAZolam  (XANAX ) 0.5 MG tablet TAKE 1 TABLET BY MOUTH EVERY DAY AS NEEDED FOR ANXIETY   anastrozole  (ARIMIDEX ) 1 MG tablet Take 1 tablet (1 mg total) by mouth daily.   aspirin  81 MG chewable tablet Chew 1 tablet (81 mg total) by mouth daily.   busPIRone  (BUSPAR ) 10 MG tablet TAKE 1 TABLET BY MOUTH TWICE A DAY   Cholecalciferol (VITAMIN D3) 25 MCG (1000 UT) CAPS Take 1 capsule by mouth daily.   clobetasol  ointment (TEMOVATE ) 0.05 % Apply 1 Application topically 2 (two) times daily. Apple 2x per day on hands   lisinopril (ZESTRIL) 10 MG tablet Take 1 tablet (10 mg total) by mouth daily.   rosuvastatin  (CRESTOR ) 40 MG tablet Take 1 tablet (40 mg total) by mouth daily.   triamcinolone  ointment (KENALOG ) 0.1 % Apply 1 Application topically 2 (two) times daily. Apply 2x daily to the body.   [DISCONTINUED] amLODipine  (NORVASC ) 10 MG tablet TAKE 1 TABLET BY MOUTH EVERY DAY   No facility-administered encounter medications on file as of 03/06/2024.     History: Past Medical History:  Diagnosis Date   Allergies    Anxiety    COPD (chronic obstructive pulmonary disease) (HCC)    Hyperglycemic coma (HCC)    Hypertension    Personal history of radiation therapy    Past Surgical History:  Procedure Laterality Date   ABDOMINAL HYSTERECTOMY     BREAST BIOPSY Left 11/13/2022   US  LT BREAST BX W LOC DEV 1ST LESION IMG BX SPEC US  GUIDE 11/13/2022 [facility]    BREAST BIOPSY  12/05/2022   MM LT RADIOACTIVE SEED LOC MAMMO GUIDE 12/05/2022 [facility]   BREAST LUMPECTOMY Left 12/06/2022   BREAST LUMPECTOMY WITH RADIOACTIVE SEED AND SENTINEL LYMPH NODE BIOPSY Left 12/06/2022   Procedure: LEFT BREAST SEED GUIDED LUMPECTOMY, LEFT AXILLARY SENTINEL NODE BIOPSY;  Surgeon: Ebbie Cough, MD;  Location: [facility];  Service: General;  Laterality: Left;  PEC BLOCK    Family History  Problem Relation Age of Onset   Heart disease Mother    Hypertension Mother    COPD Mother    Cancer Mother    Breast cancer Mother    Heart disease Father    Hypertension Father    Cancer Maternal Grandmother    Diabetes Paternal Grandmother    Heart disease Paternal Grandfather    Social History   Occupational History   Not on file  Tobacco Use   Smoking status: Every Day    Current packs/day: 1.00    Average packs/day: 1 pack/day for 15.0 years (15.0 ttl pk-yrs)    Types: Cigarettes   Smokeless tobacco: Never  Vaping Use   Vaping status: Never Used  Substance and Sexual Activity   Alcohol use: Yes    Comment: beer-1-5 beers a day   Drug use:  Yes    Types: Marijuana    Comment: yesterday last dose 12/05/22   Sexual activity: Not Currently    Tobacco Counseling NO  Ready to quit: No Counseling given: Yes   Immunizations and Health Maintenance Immunization History  Administered Date(s) Administered   Zoster Recombinant(Shingrix) 10/11/2022   Health Maintenance Due  Topic Date Due   OPHTHALMOLOGY EXAM  Never done   DEXA SCAN  Never done    Activities of Daily Living    03/06/2024   11:43 AM  In your present state of health, do you have any difficulty performing the following activities:  Hearing? 0  Vision? 1  Comment VISION CORRECTION  Difficulty concentrating or making decisions? 0  Walking or climbing stairs? 0  Dressing or bathing? 0  Doing errands, shopping? 0  Preparing Food and eating ? N  Using the  Toilet? N  In the past six months, have you accidently leaked urine? Y  Do you have problems with loss of bowel control? N  Managing your Medications? Y  Managing your Finances? Y  Housekeeping or managing your Housekeeping? Y    Physical Exam   Physical Exam Vitals and nursing note reviewed.  Constitutional:      Appearance: Normal appearance. She is normal weight.  HENT:     Head: Normocephalic and atraumatic.  Skin:    General: Skin is warm and dry.  Neurological:     General: No focal deficit present.     Mental Status: She is alert and oriented to person, place, and time. Mental status is at baseline.  Psychiatric:        Mood and Affect: Mood normal.        Behavior: Behavior normal.        Thought Content: Thought content normal.        Judgment: Judgment normal.    (optional), or other factors deemed appropriate based on the beneficiary's medical and social history and current clinical standards.   Advanced Directives: WILL PROVIDE AT CHECK OUT  Does Patient Have a Medical Advance Directive?: No Would patient like information on creating a medical advance directive?: Yes (Inpatient - patient requests chaplain consult to create a medical advance directive)  EKG:  normal EKG, normal sinus rhythm, unchanged from previous tracings      Assessment:    This is a routine wellness examination for this patient . Yes   Vision/Hearing screen No results found. SEES [facility] [facility]    Goals      Activity and Exercise Increased     Evidence-based guidance:  Review current exercise levels.  Assess patient perspective on exercise or activity level, barriers to increasing activity, motivation and readiness for change.  Recommend or set healthy exercise goal based on individual tolerance.  Encourage small steps toward making change in amount of exercise or activity.  Urge reduction of sedentary activities or screen time.  Promote group activities within the community or  with family or support person.  Consider referral to rehabiliation therapist for assessment and exercise/activity plan.   Notes:  JOIN THE GYM : TO GET HEALTHY         Depression Screen    03/06/2024   11:46 AM 09/24/2023    2:12 PM 08/24/2023    2:54 PM 02/14/2023    3:26 PM  PHQ 2/9 Scores  PHQ - 2 Score 0 2 0 3  PHQ- 9 Score  2  13     Fall Risk    03/06/2024  11:45 AM  Fall Risk   Falls in the past year? 1  Number falls in past yr: 0  Injury with Fall? 0  Risk for fall due to : No Fall Risks  Follow up Falls evaluation completed    Cognitive Function:        03/06/2024   11:54 AM  6CIT Screen  What Year? 0 points  What month? 0 points  What time? 0 points  Count back from 20 0 points  Months in reverse 0 points  Repeat phrase 0 points  Total Score 0 points    Patient Care Team: Kayla Jeoffrey RAMAN, FNP as PCP - General (Family Medicine) Ebbie Cough, MD as Consulting Physician (General Surgery) Izell Domino, MD as Consulting Physician (Radiation Oncology) Odean Potts, MD as Consulting Physician (Hematology and Oncology)     Plan:   WILL JOIN GYM   I have personally reviewed and noted the following in the patient's chart:   Medical and social history Use of alcohol, tobacco or illicit drugs  Current medications and supplements including opioid prescriptions. Patient is not currently taking opioid prescriptions. Functional ability and status Nutritional status Physical activity Advanced directives List of other physicians Hospitalizations, surgeries, and ER visits in previous 12 months Vitals Screenings to include cognitive, depression, and falls Referrals and appointments  In addition, I have reviewed and discussed with patient certain preventive protocols, quality metrics, and best practice recommendations. A written personalized care plan for preventive services as well as general preventive health recommendations were provided to  patient.     Jeoffrey RAMAN Kayla, FNP 03/06/2024

## 2024-03-06 NOTE — Addendum Note (Signed)
 Addended by: CORINNA BRISKER R on: 03/06/2024 12:34 PM   Modules accepted: Orders

## 2024-03-06 NOTE — Progress Notes (Signed)
 Acute Office Visit  Patient ID: Bethany Mills, female    DOB: 1960/05/11, 63 y.o.   MRN: [id number]  PCP: Bethany Jeoffrey RAMAN, FNP  Chief Complaint  Patient presents with   welcome to Medicare     Subjective:     HPI  Discussed the use of AI scribe software for clinical note transcription with the patient, who gave verbal consent to proceed.  History of Present Illness Bethany Mills is a 63 year old female with prediabetes and hypertension who presents for a follow-up visit.  She experienced a fall down the steps about a month ago, resulting in a bruise on her left thigh. There was no head injury, loss of consciousness, or neurological symptoms such as headaches, confusion, nausea, or vomiting. The bruise was initially severe but is no longer painful.  She has a history of prediabetes, with a previous A1c of 6.5, which has improved to the prediabetes range. She manages her condition with diet and exercise and is not currently on medication for diabetes. Current A1c 6.3%.  Her hypertension was previously managed with amlodipine . Is not monitoring home BP.   She received a shingles vaccine last year but has not completed the second dose. She declines other vaccines such as flu and pneumonia.  Her social history includes caring for family members with health issues, including a son with early onset dementia and a 62 year old neighbor. She acknowledges dietary challenges, particularly with carbohydrates like potatoes and macaroni and cheese, and drinks sweet tea. She is working on reducing sugar intake.   Review of Systems  All other systems reviewed and are negative.      Objective:    BP 135/75   Pulse 71   Temp (!) 97.5 F (36.4 C)   Ht 5' 3 (1.6 m)   Wt 141 lb 9.6 oz (64.2 kg)   SpO2 97%   BMI 25.08 kg/m    Physical Exam Vitals and nursing note reviewed.  Constitutional:      Appearance: Normal appearance. She is normal weight.  HENT:     Head:  Normocephalic and atraumatic.  Skin:    General: Skin is warm and dry.  Neurological:     General: No focal deficit present.     Mental Status: She is alert and oriented to person, place, and time. Mental status is at baseline.  Psychiatric:        Mood and Affect: Mood normal.        Behavior: Behavior normal.        Thought Content: Thought content normal.        Judgment: Judgment normal.       No results found for any visits on 03/06/24.     Assessment & Plan:   Problem List Items Addressed This Visit     Mixed hyperlipidemia   Relevant Medications   lisinopril (ZESTRIL) 10 MG tablet   Controlled type 2 diabetes mellitus without complication, without long-term current use of insulin (HCC)   Relevant Medications   lisinopril (ZESTRIL) 10 MG tablet   Primary hypertension   Relevant Medications   lisinopril (ZESTRIL) 10 MG tablet   Encounter for Medicare annual wellness exam - Primary   Relevant Orders   EKG 12-Lead    Assessment and Plan Assessment & Plan Primary hypertension with proteinuria Blood pressure slightly elevated at 135 mmHg. Proteinuria suggests kidney involvement. Recommended switching to lisinopril for better kidney protection and blood pressure control. Discussed benefits of lisinopril over amlodipine .  She agreed to switch medications. - Switch from amlodipine  to lisinopril 10 mg daily. - Monitor blood pressure at home. - Follow up in 2-4 weeks to assess blood pressure control.  Type 2 diabetes mellitus without complication A1c improved to prediabetes range. Medication adjustment not needed for diabetes at this time. Emphasized diet and exercise for control. Regular monitoring of kidney function and eye health necessary. - Encourage diet and exercise to maintain A1c in prediabetes range. - Monitor kidney function and eye health annually.  Mixed hyperlipidemia Cholesterol levels need improvement. On maximum dose of rosuvastatin . Emphasized dietary  modifications and physical activity. - Encourage healthy diet and regular exercise. - Recheck cholesterol levels in 3 months.  Adult Wellness Visit Annual wellness visit completed. Discussed importance of annual check-ups and Medicare resources. - Schedule annual wellness visit via phone.  Bruise, left thigh Bruise from fall a month ago. No pain or complications. No loss of consciousness or head injury reported.  General Health Maintenance Discussed osteoporosis screening and vaccinations. Missed DEXA scan. Needs second shingles vaccine dose. Declined flu and pneumonia vaccines. Discussed smoking cessation. - Reschedule DEXA scan for osteoporosis screening. - Administer second dose of shingles vaccine. - Encourage smoking cessation.    Meds ordered this encounter  Medications   lisinopril (ZESTRIL) 10 MG tablet    Sig: Take 1 tablet (10 mg total) by mouth daily.    Dispense:  90 tablet    Refill:  0    Supervising Provider:   DUANNE LOWERS T [[id number]]    Return in 4 weeks (on 04/03/2024) for hypertension and 3 months with labs week prior.  Jeoffrey GORMAN Barrio, FNP [facility]

## 2024-03-06 NOTE — Patient Instructions (Signed)
  Bethany Mills , Thank you for taking time to come for your Medicare Wellness Visit. I appreciate your ongoing commitment to your health goals. Please review the following plan we discussed and let me know if I can assist you in the future.   These are the goals we discussed:  Goals      Activity and Exercise Increased     Evidence-based guidance:  Review current exercise levels.  Assess patient perspective on exercise or activity level, barriers to increasing activity, motivation and readiness for change.  Recommend or set healthy exercise goal based on individual tolerance.  Encourage small steps toward making change in amount of exercise or activity.  Urge reduction of sedentary activities or screen time.  Promote group activities within the community or with family or support person.  Consider referral to rehabiliation therapist for assessment and exercise/activity plan.   Notes:  JOIN THE GYM : TO GET HEALTHY         This is a list of the screening recommended for you and due dates:  Health Maintenance  Topic Date Due   Eye exam for diabetics  Never done   Pneumococcal Vaccine for age over 42 (1 of 2 - PCV) Never done   Zoster (Shingles) Vaccine (1 of 2) Never done   Flu Shot  Never done   DEXA scan (bone density measurement)  Never done   Cologuard (Stool DNA test)  09/23/2024*   Hemoglobin A1C  08/28/2024   Complete foot exam   11/04/2024   Breast Cancer Screening  12/18/2024   Yearly kidney function blood test for diabetes  02/27/2025   Yearly kidney health urinalysis for diabetes  02/27/2025   Medicare Annual Wellness Visit  03/06/2025   Pap with HPV screening  10/11/2027   Hepatitis C Screening  Completed   HIV Screening  Completed   Hepatitis B Vaccine  Aged Out   Meningitis B Vaccine  Aged Out   DTaP/Tdap/Td vaccine  Discontinued   COVID-19 Vaccine  Discontinued  *Topic was postponed. The date shown is not the original due date.

## 2024-03-12 ENCOUNTER — Other Ambulatory Visit: Payer: Self-pay | Admitting: Family Medicine

## 2024-03-24 ENCOUNTER — Ambulatory Visit (INDEPENDENT_AMBULATORY_CARE_PROVIDER_SITE_OTHER): Admitting: Family Medicine

## 2024-03-24 ENCOUNTER — Encounter: Payer: Self-pay | Admitting: Family Medicine

## 2024-03-24 VITALS — BP 107/70 | HR 87 | Temp 98.2°F | Ht 63.0 in | Wt 140.2 lb

## 2024-03-24 DIAGNOSIS — R1031 Right lower quadrant pain: Secondary | ICD-10-CM | POA: Diagnosis not present

## 2024-03-24 NOTE — Progress Notes (Signed)
 Acute Office Visit  Patient ID: Bethany Mills, female    DOB: 1960-08-30, 63 y.o.   MRN: [id number]  PCP: Kayla Jeoffrey RAMAN, FNP  Chief Complaint  Patient presents with   Acute Visit    Abdominal pain x 4 days she states she has been in bed since Friday      Subjective:     HPI  Discussed the use of AI scribe software for clinical note transcription with the patient, who gave verbal consent to proceed.  History of Present Illness Bethany Mills is a 63 year old female who presents with right lower abdominal pain.  She has been experiencing right lower abdominal pain since last Friday, initially fearing appendicitis. The pain is localized, does not radiate, and is exacerbated by certain movements. It has been gradually improving over the past few days.  No associated fever, nausea, vomiting, or changes in bowel habits, although she has diarrhea, which is not new. She reports a decreased appetite, attributing it to the pain. No changes in urination or blood in stool.  Prior to the onset of this pain Bethany Mills reports excessive coughing due to smoking as well as increased physical activity unloading and putting up [employer].   Her social history includes recent increased physical activity related to holiday preparations, such as putting up Christmas decorations, which she believes may have aggravated her condition. She is concerned about her health due to a past cancer diagnosis and the stress of recent life events, including the loss of her mother.   Review of Systems  All other systems reviewed and are negative.   Past Medical History:  Diagnosis Date   Allergies    Anxiety    COPD (chronic obstructive pulmonary disease) (HCC)    Hyperglycemic coma (HCC)    Hypertension    Personal history of radiation therapy     Past Surgical History:  Procedure Laterality Date   ABDOMINAL HYSTERECTOMY     BREAST BIOPSY Left 11/13/2022   US  LT BREAST BX W LOC DEV  1ST LESION IMG BX SPEC US  GUIDE 11/13/2022 [facility]   BREAST BIOPSY  12/05/2022   MM LT RADIOACTIVE SEED LOC MAMMO GUIDE 12/05/2022 [facility]   BREAST LUMPECTOMY Left 12/06/2022   BREAST LUMPECTOMY WITH RADIOACTIVE SEED AND SENTINEL LYMPH NODE BIOPSY Left 12/06/2022   Procedure: LEFT BREAST SEED GUIDED LUMPECTOMY, LEFT AXILLARY SENTINEL NODE BIOPSY;  Surgeon: Ebbie Cough, MD;  Location: [facility];  Service: General;  Laterality: Left;  PEC BLOCK    Current Outpatient Medications on File Prior to Visit  Medication Sig Dispense Refill   ALPRAZolam  (XANAX ) 0.5 MG tablet TAKE 1 TABLET BY MOUTH EVERY DAY AS NEEDED FOR ANXIETY 30 tablet 0   anastrozole  (ARIMIDEX ) 1 MG tablet Take 1 tablet (1 mg total) by mouth daily. 90 tablet 3   aspirin  81 MG chewable tablet Chew 1 tablet (81 mg total) by mouth daily. 30 tablet 0   busPIRone  (BUSPAR ) 10 MG tablet TAKE 1 TABLET BY MOUTH TWICE A DAY 60 tablet 3   Cholecalciferol (VITAMIN D3) 25 MCG (1000 UT) CAPS Take 1 capsule by mouth daily.     clobetasol  ointment (TEMOVATE ) 0.05 % Apply 1 Application topically 2 (two) times daily. Apple 2x per day on hands 30 g 2   lisinopril (ZESTRIL) 10 MG tablet Take 1 tablet (10 mg total) by mouth daily. 90 tablet 0   rosuvastatin  (CRESTOR ) 40 MG tablet Take 1 tablet (40 mg  total) by mouth daily. 90 tablet 1   triamcinolone  ointment (KENALOG ) 0.1 % Apply 1 Application topically 2 (two) times daily. Apply 2x daily to the body. 453 g 2   No current facility-administered medications on file prior to visit.    Allergies  Allergen Reactions   Codeine Nausea And Vomiting   Neomycin-Polymyxin B Gu Other (See Comments)    Causes eyes to swell, run and crust over.    Neomycin-Bacitracin Zn-Polymyx Hives and Rash       Objective:    BP 107/70   Pulse 87   Temp 98.2 F (36.8 C)   Ht 5' 3 (1.6 m)   Wt 140 lb 3.2 oz (63.6 kg)   SpO2 97%   BMI 24.84 kg/m    Physical  Exam Vitals and nursing note reviewed.  Constitutional:      Appearance: Normal appearance. She is normal weight.  HENT:     Head: Normocephalic and atraumatic.  Abdominal:     General: Bowel sounds are normal. There is no distension.     Palpations: Abdomen is soft.     Tenderness: There is abdominal tenderness in the right lower quadrant. There is no guarding or rebound.  Skin:    General: Skin is warm and dry.  Neurological:     General: No focal deficit present.     Mental Status: She is alert and oriented to person, place, and time. Mental status is at baseline.  Psychiatric:        Mood and Affect: Mood normal.        Behavior: Behavior normal.        Thought Content: Thought content normal.        Judgment: Judgment normal.       No results found for any visits on 03/24/24.     Assessment & Plan:   Problem List Items Addressed This Visit   None Visit Diagnoses       Right lower quadrant abdominal pain    -  Primary       Assessment and Plan Assessment & Plan Right lower quadrant abdominal pain Intermittent pain with differential diagnosis including appendicitis, bowel issues, muscle strain, or ovarian pain. Pain improving, no definitive signs of appendicitis. - Monitor symptoms closely. - Advised immediate medical attention if fever, nausea, vomiting, or worsening pain occurs. - Consider CT scan if symptoms do not improve or worsen, however she would like to defer for now. - Encouraged low threshold for further evaluation if symptoms escalate.    No orders of the defined types were placed in this encounter.   Return if symptoms worsen or fail to improve.  Jeoffrey GORMAN Barrio, FNP [place] [facility]

## 2024-04-02 ENCOUNTER — Other Ambulatory Visit: Payer: Self-pay | Admitting: Hematology and Oncology

## 2024-04-07 ENCOUNTER — Ambulatory Visit: Admitting: Family Medicine

## 2024-04-07 ENCOUNTER — Encounter: Payer: Self-pay | Admitting: Family Medicine

## 2024-04-07 VITALS — BP 132/73 | HR 80 | Temp 97.9°F | Ht 63.0 in | Wt 138.8 lb

## 2024-04-07 DIAGNOSIS — F418 Other specified anxiety disorders: Secondary | ICD-10-CM

## 2024-04-07 DIAGNOSIS — E782 Mixed hyperlipidemia: Secondary | ICD-10-CM

## 2024-04-07 DIAGNOSIS — I10 Essential (primary) hypertension: Secondary | ICD-10-CM

## 2024-04-07 MED ORDER — ALPRAZOLAM 0.5 MG PO TABS: 0.5000 mg | ORAL_TABLET | Freq: Every day | ORAL | 0 refills | Status: DC

## 2024-04-07 MED ORDER — LISINOPRIL 20 MG PO TABS: 20.0000 mg | ORAL_TABLET | Freq: Every day | ORAL | Status: DC

## 2024-04-07 NOTE — Progress Notes (Signed)
 Acute Office Visit  Patient ID: Bethany Mills, female    DOB: 12/18/1960, 63 y.o.   MRN: [id number]  PCP: Kayla Jeoffrey RAMAN, FNP  Chief Complaint  Patient presents with   Follow-up    1 mo f/u    Subjective:     HPI  Bethany Mills is here today for follow up on blood pressure. Was switched to Lisinopril  1 month ago from amlodipine  for renal benefits.   Discussed the use of AI scribe software for clinical note transcription with the patient, who gave verbal consent to proceed.  History of Present Illness Bethany Mills is a 63 year old female who presents for blood pressure management.  Bethany Mills blood pressure has been stable, with readings typically ranging from 120 to 130 systolic and around 63 diastolic. Bethany Mills mentions that Bethany Mills blood pressure increased on one occasion but has been stable otherwise. No chest pain, palpitations, headaches, vision changes, swelling in Bethany Mills arms or legs, or shortness of breath.  Recently received refill of Rosuvastatin  20mg  daily versus the ordered 40mg  daily. Is unsure what Bethany Mills was taking prior to the refill.   Bethany Mills continues to require xanax  0.5mg  daily PRN for uncontrolled anxiety. Has tried taking half however Bethany Mills finds this ineffective. Is taking most days of the week. Is also taking Buspar  BID.   Review of Systems  All other systems reviewed and are negative.      Objective:    BP 132/73   Pulse 80   Temp 97.9 F (36.6 C)   Ht 5' 3 (1.6 m)   Wt 138 lb 12.8 oz (63 kg)   SpO2 97%   BMI 24.59 kg/m  BP Readings from Last 3 Encounters:  04/07/24 132/73  03/24/24 107/70  03/06/24 135/75   Wt Readings from Last 3 Encounters:  04/07/24 138 lb 12.8 oz (63 kg)  03/24/24 140 lb 3.2 oz (63.6 kg)  03/06/24 141 lb 9.6 oz (64.2 kg)      Physical Exam Vitals and nursing note reviewed.  Constitutional:      Appearance: Normal appearance. Bethany Mills is normal weight.  HENT:     Head: Normocephalic and atraumatic.  Cardiovascular:     Rate and  Rhythm: Normal rate and regular rhythm.     Pulses: Normal pulses.     Heart sounds: Normal heart sounds.  Pulmonary:     Effort: Pulmonary effort is normal.     Breath sounds: Normal breath sounds.  Skin:    General: Skin is warm and dry.  Neurological:     General: No focal deficit present.     Mental Status: Bethany Mills is alert and oriented to person, place, and time. Mental status is at baseline.  Psychiatric:        Mood and Affect: Mood normal.        Behavior: Behavior normal.        Thought Content: Thought content normal.        Judgment: Judgment normal.       No results found for any visits on 04/07/24.     Assessment & Plan:   Problem List Items Addressed This Visit     Mixed hyperlipidemia   Relevant Medications   lisinopril  (ZESTRIL ) 20 MG tablet   Situational anxiety   Relevant Medications   ALPRAZolam  (XANAX ) 0.5 MG tablet   Primary hypertension - Primary   Relevant Medications   lisinopril  (ZESTRIL ) 20 MG tablet    Assessment and Plan Assessment & Plan Hypertension Blood  pressure not at goal <130/80. No cardiovascular symptoms. Smoking cessation ongoing. - Encouraged smoking cessation. - Increase Lisinopril  to 20mg  daily - Continue to monitor BP at home and return to office in 4-6 weeks or sooner if needed.  Anxiety - Controlled on Buspar  and Xanax  PRN.  - Recommended using Xanax  as sparingly as possible. Discussed risks of dependency.  Hyperlipidemia - Will call pharmacy to clarify dose history and resume 40mg  daily if Bethany Mills was previously taking that dose. - Well controlled on current dose.  - Recheck labs in 6 weeks prior to next office visit.    Meds ordered this encounter  Medications   ALPRAZolam  (XANAX ) 0.5 MG tablet    Sig: Take 1 tablet (0.5 mg total) by mouth at bedtime.    Dispense:  30 tablet    Refill:  0    Not to exceed 5 additional fills before 08/09/2024   lisinopril  (ZESTRIL ) 20 MG tablet    Sig: Take 1 tablet (20 mg total)  by mouth daily.    Supervising Provider:   DUANNE LOWERS T [[id number]]    Return for as scheduled or sooner if needed.  Jeoffrey GORMAN Barrio, FNP [facility]

## 2024-05-12 ENCOUNTER — Other Ambulatory Visit: Payer: Self-pay | Admitting: Family Medicine

## 2024-05-12 NOTE — Telephone Encounter (Signed)
 Copied from CRM [id number]. Topic: Clinical - Medication Refill >> May 12, 2024  8:41 AM Gattis SQUIBB wrote: Medication: Lisinopril  20 mg  Has the patient contacted their pharmacy? Yes  This is the patient's preferred pharmacy:  [id number] - [place], [place] - [address] [facility] [address] [place] [address] Phone: [phone] Fax: [phone]  Is this the correct pharmacy for this prescription? Yes If no, delete pharmacy and type the correct one.   Has the prescription been filled recently? Yes  Is the patient out of the medication? Yes  Has the patient been seen for an appointment in the last year OR does the patient have an upcoming appointment? Yes  Can we respond through MyChart? No  Agent: Please be advised that Rx refills may take up to 3 business days. We ask that you follow-up with your pharmacy.

## 2024-05-13 MED ORDER — LISINOPRIL 20 MG PO TABS: 20.0000 mg | ORAL_TABLET | Freq: Every day | ORAL | 1 refills | Status: AC

## 2024-05-13 NOTE — Telephone Encounter (Signed)
 Requested Prescriptions  Pending Prescriptions Disp Refills   lisinopril  (ZESTRIL ) 20 MG tablet 90 tablet 1    Sig: Take 1 tablet (20 mg total) by mouth daily.     Cardiovascular:  ACE Inhibitors Passed - 05/13/2024  1:38 PM      Passed - Cr in normal range and within 180 days    Creat  Date Value Ref Range Status  02/28/2024 0.65 0.50 - 1.05 mg/dL Final   Creatinine, Urine  Date Value Ref Range Status  02/28/2024 172 20 - 275 mg/dL Final         Passed - K in normal range and within 180 days    Potassium  Date Value Ref Range Status  02/28/2024 4.3 3.5 - 5.3 mmol/L Final         Passed - Patient is not pregnant      Passed - Last BP in normal range    BP Readings from Last 1 Encounters:  04/07/24 132/73         Passed - Valid encounter within last 6 months    Recent Outpatient Visits           1 month ago Primary hypertension   [place] [facility] Kayla Jeoffrey RAMAN, FNP   1 month ago Right lower quadrant abdominal pain   [place] [facility] Kayla Jeoffrey RAMAN, FNP   2 months ago Encounter for [employer] annual wellness exam   [place] [facility] Kayla Jeoffrey RAMAN, FNP   6 months ago Physical exam, annual   [place] [facility] Kayla Jeoffrey RAMAN, FNP   7 months ago Controlled type 2 diabetes mellitus without complication, without long-term current use of insulin [facility])   [place] [facility] Kayla Jeoffrey RAMAN, [place]

## 2024-05-14 ENCOUNTER — Other Ambulatory Visit: Payer: Self-pay | Admitting: Family Medicine

## 2024-06-06 ENCOUNTER — Other Ambulatory Visit

## 2024-06-09 ENCOUNTER — Ambulatory Visit: Admitting: Family Medicine

## 2024-06-25 ENCOUNTER — Ambulatory Visit: Admitting: Family Medicine

## 2024-12-31 ENCOUNTER — Ambulatory Visit: Payer: MEDICAID | Admitting: Hematology and Oncology
